# Patient Record
Sex: Female | Born: 1960
Health system: Southern US, Community
[De-identification: ages and names within clinical notes are randomized; demographics above are authoritative.]

## PROBLEM LIST (undated history)

## (undated) DIAGNOSIS — I10 Essential (primary) hypertension: Secondary | ICD-10-CM

## (undated) DIAGNOSIS — J45909 Unspecified asthma, uncomplicated: Secondary | ICD-10-CM

## (undated) DIAGNOSIS — F32A Depression, unspecified: Secondary | ICD-10-CM

## (undated) DIAGNOSIS — M797 Fibromyalgia: Secondary | ICD-10-CM

## (undated) DIAGNOSIS — F419 Anxiety disorder, unspecified: Secondary | ICD-10-CM

## (undated) DIAGNOSIS — K219 Gastro-esophageal reflux disease without esophagitis: Secondary | ICD-10-CM

## (undated) HISTORY — PX: THROAT SURGERY: SHX803

## (undated) HISTORY — PX: CYST EXCISION: SHX5701

## (undated) HISTORY — PX: COLONOSCOPY: SHX174

## (undated) HISTORY — PX: BREAST BIOPSY: SHX20

---

## 1990-09-14 HISTORY — PX: TUBAL LIGATION: SHX77

## 2004-10-15 ENCOUNTER — Ambulatory Visit: Payer: Self-pay | Admitting: Family Medicine

## 2005-02-19 ENCOUNTER — Ambulatory Visit: Payer: Self-pay | Admitting: Family Medicine

## 2005-04-16 ENCOUNTER — Ambulatory Visit: Payer: Self-pay | Admitting: Internal Medicine

## 2005-05-08 ENCOUNTER — Ambulatory Visit: Payer: Self-pay | Admitting: Internal Medicine

## 2005-08-17 ENCOUNTER — Ambulatory Visit: Payer: Self-pay | Admitting: Family Medicine

## 2005-08-21 ENCOUNTER — Ambulatory Visit: Payer: Self-pay | Admitting: Family Medicine

## 2005-09-04 ENCOUNTER — Ambulatory Visit: Payer: Self-pay | Admitting: Family Medicine

## 2005-11-03 ENCOUNTER — Ambulatory Visit: Payer: Self-pay | Admitting: Family Medicine

## 2005-11-16 ENCOUNTER — Ambulatory Visit: Payer: Self-pay | Admitting: Gastroenterology

## 2006-05-12 ENCOUNTER — Ambulatory Visit: Payer: Self-pay | Admitting: Family Medicine

## 2007-02-14 ENCOUNTER — Ambulatory Visit: Payer: Self-pay | Admitting: Specialist

## 2007-02-22 ENCOUNTER — Ambulatory Visit: Payer: Self-pay | Admitting: Gastroenterology

## 2007-05-28 IMAGING — CR DG ABDOMEN 1V
1 series · 1 of 1 positions shown · non-contrast
Comparison: none

REASON FOR EXAM: Left flank/lumbar pain
COMMENTS:

[view not recorded]
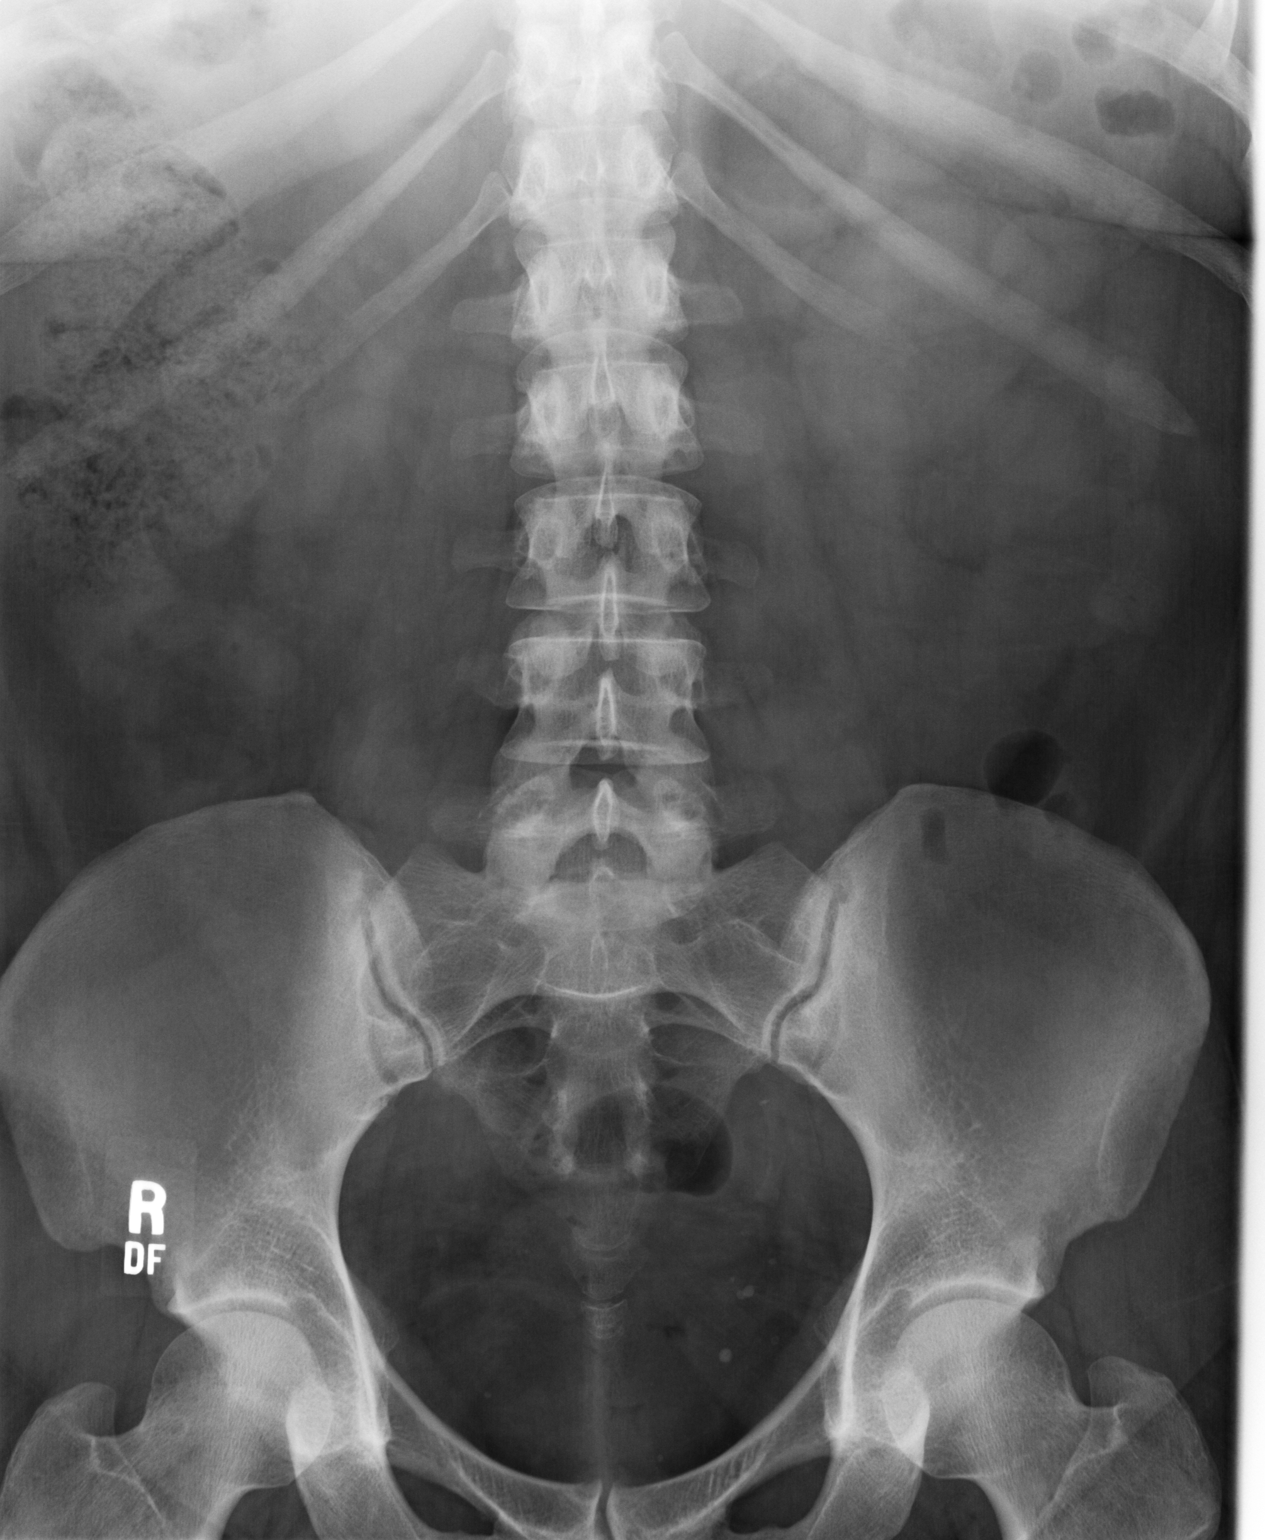

[1 of 1 positions shown; findings below may reference images not displayed]

PROCEDURE:     DXR - DXR KIDNEY URETER BLADDER  - August 17, 2005  [DATE]

RESULT:     AP view of the abdomen shows a normal bowel gas pattern. There
is a moderate amount of fecal material in the RIGHT colon. No evidence for
bowel obstruction is seen. There are a few, nonspecific calcifications in
the LEFT pelvis. Most of these are thought to represent phleboliths but the
possibility of at least one of these representing a distal LEFT ureteral
stone cannot be totally excluded on plain film examination. If such is of
clinical consideration, further evaluation by CT or IVP might be desired.
The psoas margins are visualized bilaterally. The osseous structures are
normal in appearance.
IMPRESSION: 1.     There are noted nonspecific calcifications in the LEFT pelvis as
noted above.
2.     No bowel obstruction is seen.
3.     There is a moderate amount of fecal material in the RIGHT colon.

## 2013-11-14 HISTORY — PX: DIRECT LARYNGOSCOPY: SHX5326

## 2017-03-03 DIAGNOSIS — M545 Low back pain, unspecified: Secondary | ICD-10-CM | POA: Insufficient documentation

## 2017-03-03 DIAGNOSIS — R52 Pain, unspecified: Secondary | ICD-10-CM | POA: Insufficient documentation

## 2017-06-30 ENCOUNTER — Other Ambulatory Visit: Payer: Self-pay | Admitting: Thoracic Surgery

## 2017-06-30 DIAGNOSIS — M545 Low back pain: Secondary | ICD-10-CM

## 2017-07-13 ENCOUNTER — Ambulatory Visit: Payer: Disability Insurance | Attending: Thoracic Surgery

## 2017-07-13 DIAGNOSIS — K589 Irritable bowel syndrome without diarrhea: Secondary | ICD-10-CM | POA: Insufficient documentation

## 2017-07-13 DIAGNOSIS — F329 Major depressive disorder, single episode, unspecified: Secondary | ICD-10-CM | POA: Diagnosis not present

## 2017-07-13 DIAGNOSIS — R200 Anesthesia of skin: Secondary | ICD-10-CM | POA: Diagnosis not present

## 2017-07-13 DIAGNOSIS — M797 Fibromyalgia: Secondary | ICD-10-CM | POA: Diagnosis not present

## 2017-07-13 DIAGNOSIS — M545 Low back pain: Secondary | ICD-10-CM

## 2017-07-13 DIAGNOSIS — M7989 Other specified soft tissue disorders: Secondary | ICD-10-CM | POA: Insufficient documentation

## 2017-07-13 DIAGNOSIS — J984 Other disorders of lung: Secondary | ICD-10-CM | POA: Insufficient documentation

## 2017-07-13 DIAGNOSIS — F419 Anxiety disorder, unspecified: Secondary | ICD-10-CM | POA: Insufficient documentation

## 2017-07-13 DIAGNOSIS — M549 Dorsalgia, unspecified: Secondary | ICD-10-CM | POA: Diagnosis not present

## 2017-07-13 MED ORDER — ALBUTEROL SULFATE (2.5 MG/3ML) 0.083% IN NEBU
2.5000 mg | INHALATION_SOLUTION | Freq: Once | RESPIRATORY_TRACT | Status: AC
Start: 2017-07-13 — End: 2017-07-13
  Administered 2017-07-13: 2.5 mg via RESPIRATORY_TRACT
  Filled 2017-07-13: qty 3

## 2018-01-27 DIAGNOSIS — F329 Major depressive disorder, single episode, unspecified: Secondary | ICD-10-CM | POA: Insufficient documentation

## 2018-11-14 DIAGNOSIS — I10 Essential (primary) hypertension: Secondary | ICD-10-CM | POA: Insufficient documentation

## 2019-04-13 DIAGNOSIS — Z800 Family history of malignant neoplasm of digestive organs: Secondary | ICD-10-CM | POA: Insufficient documentation

## 2021-09-15 ENCOUNTER — Ambulatory Visit: Payer: Self-pay

## 2021-09-30 ENCOUNTER — Ambulatory Visit (INDEPENDENT_AMBULATORY_CARE_PROVIDER_SITE_OTHER): Payer: Managed Care, Other (non HMO)

## 2021-09-30 ENCOUNTER — Other Ambulatory Visit: Payer: Self-pay

## 2021-09-30 ENCOUNTER — Ambulatory Visit
Admission: RE | Admit: 2021-09-30 | Discharge: 2021-09-30 | Disposition: A | Discharge status: Home / Self Care | Payer: Managed Care, Other (non HMO) | Source: Ambulatory Visit

## 2021-09-30 VITALS — BP 176/96 | HR 73 | Temp 98.8°F | Resp 16

## 2021-09-30 DIAGNOSIS — R051 Acute cough: Secondary | ICD-10-CM | POA: Diagnosis not present

## 2021-09-30 DIAGNOSIS — R059 Cough, unspecified: Secondary | ICD-10-CM | POA: Diagnosis not present

## 2021-09-30 DIAGNOSIS — J019 Acute sinusitis, unspecified: Secondary | ICD-10-CM | POA: Diagnosis not present

## 2021-09-30 DIAGNOSIS — R0981 Nasal congestion: Secondary | ICD-10-CM

## 2021-09-30 HISTORY — DX: Anxiety disorder, unspecified: F41.9

## 2021-09-30 HISTORY — DX: Fibromyalgia: M79.7

## 2021-09-30 HISTORY — DX: Essential (primary) hypertension: I10

## 2021-09-30 HISTORY — DX: Gastro-esophageal reflux disease without esophagitis: K21.9

## 2021-09-30 HISTORY — DX: Depression, unspecified: F32.A

## 2021-09-30 MED ORDER — DOXYCYCLINE HYCLATE 100 MG PO CAPS: 100.0000 mg | ORAL_CAPSULE | Freq: Two times a day (BID) | ORAL | 0 refills | Status: AC

## 2021-09-30 MED ORDER — GUAIFENESIN-CODEINE 100-10 MG/5ML PO SYRP: 10.0000 mL | ORAL_SOLUTION | Freq: Three times a day (TID) | ORAL | 0 refills | Status: DC | PRN

## 2021-09-30 NOTE — ED Provider Notes (Signed)
[facility]    CSN: [id number] Arrival date & time: 09/30/21  1435      History   Chief Complaint Chief Complaint  Patient presents with   Appointment    1500   Generalized Body Aches   Nasal Congestion   Headache   Cough    HPI Angela Deleon is a 61 y.o. female presenting for nearly 1 month history of cough and nasal congestion.  Patient states she felt better until a few days ago but then her nasal congestion got worse and now she is complaining of a lot of facial pain and pressure.  Denies any fevers.  Reports increase in body aches but has history of fibromyalgia.  Has not had any fevers.  Cough is occasionally productive.  Admits to occasional shortness of breath as well.  Denies headaches, ear pain, chest pain, nausea/vomiting or diarrhea.  Has been taking multiple over-the-counter cough medicines but says they have not really helped.  Has been having sleep trouble due to cough.  No sick contacts or known exposure to COVID.  Has tested negative for COVID-19 multiple times.  HPI  Past Medical History:  Diagnosis Date   Acid reflux    Anxiety    Depression    Fibromyalgia    Hypertension     There are no problems to display for this patient.   Past Surgical History:  Procedure Laterality Date   THROAT SURGERY      OB History   No obstetric history on file.      Home Medications    Prior to Admission medications   Medication Sig Start Date End Date Taking? Authorizing Provider  doxycycline (VIBRAMYCIN) 100 MG capsule Take 1 capsule (100 mg total) by mouth 2 (two) times daily for 7 days. 09/30/21 10/07/21 Yes Laurene Footman B, PA-C  escitalopram (LEXAPRO) 20 MG tablet Take by mouth. 12/17/20 12/17/21 Yes [provider]  famotidine (PEPCID) 20 MG tablet Take by mouth. 03/13/19  Yes [provider]  gabapentin (NEURONTIN) 300 MG capsule Take by mouth. 04/13/19  Yes [provider]  guaiFENesin-codeine (ROBITUSSIN AC) 100-10 MG/5ML  syrup Take 10 mLs by mouth 3 (three) times daily as needed for cough. 09/30/21  Yes Danton Clap, PA-C  lisinopril-hydrochlorothiazide (ZESTORETIC) 20-12.5 MG tablet Take 1 tablet by mouth daily. 01/27/18  Yes [provider]    Family History History reviewed. No pertinent family history.  Social History Social History   Tobacco Use   Smoking status: Every Day    Types: Cigarettes   Smokeless tobacco: Never  Vaping Use   Vaping Use: Never used  Substance Use Topics   Alcohol use: Yes    Comment: social drinker   Drug use: Never     Allergies   Iodinated contrast media and Penicillins   Review of Systems Review of Systems  Constitutional:  Positive for fatigue. Negative for chills, diaphoresis and fever.  HENT:  Positive for rhinorrhea, sinus pressure and sinus pain. Negative for congestion, ear pain and sore throat.   Respiratory:  Positive for cough and shortness of breath.   Cardiovascular:  Negative for chest pain.  Gastrointestinal:  Negative for abdominal pain, nausea and vomiting.  Musculoskeletal:  Positive for myalgias. Negative for arthralgias.  Skin:  Negative for rash.  Neurological:  Negative for weakness and headaches.  Hematological:  Negative for adenopathy.  Psychiatric/Behavioral:  Positive for sleep disturbance.     Physical Exam Triage Vital Signs ED Triage Vitals  Enc  Vitals Group     BP 09/30/21 1455 (S) (!) 176/96     Pulse Rate 09/30/21 1455 73     Resp 09/30/21 1455 16     Temp 09/30/21 1455 98.8 F (37.1 C)     Temp Source 09/30/21 1455 Oral     SpO2 09/30/21 1455 95 %     Weight --      Height --      Head Circumference --      Peak Flow --      Pain Score 09/30/21 1451 10     Pain Loc --      Pain Edu? --      Excl. in [place]? --    No data found.  Updated Vital Signs BP (S) (!) 176/96 (BP Location: Right Arm) Comment: did not take BP med today, states working on controlling her BP with PCP.   Pulse 73    Temp 98.8 F  (37.1 C) (Oral)    Resp 16    SpO2 95%       Physical Exam Vitals and nursing note reviewed.  Constitutional:      General: She is not in acute distress.    Appearance: Normal appearance. She is well-developed. She is ill-appearing. She is not toxic-appearing.  HENT:     Head: Normocephalic and atraumatic.     Nose: Congestion present.     Right Sinus: Maxillary sinus tenderness present.     Left Sinus: Maxillary sinus tenderness present.     Mouth/Throat:     Mouth: Mucous membranes are moist.     Pharynx: Oropharynx is clear.  Eyes:     General: No scleral icterus.       Right eye: No discharge.        Left eye: No discharge.     Conjunctiva/sclera: Conjunctivae normal.  Cardiovascular:     Rate and Rhythm: Normal rate and regular rhythm.     Heart sounds: Normal heart sounds.  Pulmonary:     Effort: Pulmonary effort is normal. No respiratory distress.     Breath sounds: Normal breath sounds.  Musculoskeletal:     Cervical back: Neck supple.  Skin:    General: Skin is dry.  Neurological:     General: No focal deficit present.     Mental Status: She is alert. Mental status is at baseline.     Motor: No weakness.     Gait: Gait normal.  Psychiatric:        Mood and Affect: Mood normal.        Behavior: Behavior normal.        Thought Content: Thought content normal.     UC Treatments / Results  Labs (all labs ordered are listed, but only abnormal results are displayed) Labs Reviewed - No data to display  EKG   Radiology DG Chest 2 View  Result Date: 09/30/2021 CLINICAL DATA:  Cough and body aches for the past 3 weeks. EXAM: CHEST - 2 VIEW COMPARISON:  CT chest dated February 14, 2007. FINDINGS: The heart size and mediastinal contours are within normal limits. Both lungs are clear. The visualized skeletal structures are unremarkable. IMPRESSION: No active cardiopulmonary disease. Electronically Signed   By: Titus Dubin M.D.   On: 09/30/2021 15:44     Procedures Procedures (including critical care time)  Medications Ordered in UC Medications - No data to display  Initial Impression / Assessment and Plan / UC Course  I have reviewed the  triage vital signs and the nursing notes.  Pertinent labs & imaging results that were available during my care of the patient were reviewed by me and considered in my medical decision making (see chart for details).  61 year old female presenting for nearly 1 month history of cough and congestion.  Patient was improving until a few days ago when her congestion worsened and she developed body aches and facial pain.  Patient is presently afebrile.  She is ill-appearing but nontoxic.  Has nasal congestion on exam as well as maxillary sinus tenderness.  Chest clear to auscultation.  Chest x-ray obtained to assess for possible pneumonia.  Reviewed by me.  Result shows no acute abnormality.  Discussed results with patient.  Advised her that her symptoms at this time are consistent with acute sinusitis.  Treating with doxycycline as she has penicillin allergy.  Also prescribed Cheratussin after reviewing controlled substance database and finding her to be low risk for abuse.  Advised to continue nasal sprays, rest and fluids.  Reviewed return and ED precautions especially relating to cough and her shortness of breath.  I offered her an inhaler but she declines.  Follow-up as needed.  Final Clinical Impressions(s) / UC Diagnoses   Final diagnoses:  Acute sinusitis, recurrence not specified, unspecified location  Acute cough  Nasal congestion     Discharge Instructions      -Your chest xray was normal. No evidence of pneumonia -You have a sinus infection. I have sent antibiotics and cough meds. Increase rest and fluids -You should feel better over the next 7-10 days. If symptoms worsen or you are not feeling better, follow up with PCP or return for re-evaluation.    ED Prescriptions     Medication Sig  Dispense Auth. Provider   doxycycline (VIBRAMYCIN) 100 MG capsule Take 1 capsule (100 mg total) by mouth 2 (two) times daily for 7 days. 14 capsule Danton Clap, PA-C   guaiFENesin-codeine (ROBITUSSIN AC) 100-10 MG/5ML syrup Take 10 mLs by mouth 3 (three) times daily as needed for cough. 118 mL Danton Clap, PA-C      I have reviewed the PDMP during this encounter.   Danton Clap, PA-C 09/30/21 1609

## 2021-09-30 NOTE — Discharge Instructions (Addendum)
-  Your chest xray was normal. No evidence of pneumonia -You have a sinus infection. I have sent antibiotics and cough meds. Increase rest and fluids -You should feel better over the next 7-10 days. If symptoms worsen or you are not feeling better, follow up with PCP or return for re-evaluation.

## 2021-09-30 NOTE — ED Triage Notes (Signed)
Patient presents to Urgent Care with complaints of cough, body aches, headache, fatigue, and cough x 3 weeks. Last negative covid test last weds. Treating symptoms with nasal spray and mucinex with some relief.   Denies fever.

## 2021-12-29 DIAGNOSIS — F4001 Agoraphobia with panic disorder: Secondary | ICD-10-CM | POA: Diagnosis not present

## 2021-12-29 DIAGNOSIS — R69 Illness, unspecified: Secondary | ICD-10-CM | POA: Diagnosis not present

## 2022-01-21 ENCOUNTER — Ambulatory Visit
Admission: EM | Admit: 2022-01-21 | Discharge: 2022-01-21 | Disposition: A | Discharge status: Home / Self Care | Payer: 59 | Attending: Internal Medicine | Admitting: Internal Medicine

## 2022-01-21 ENCOUNTER — Ambulatory Visit: Payer: Self-pay

## 2022-01-21 DIAGNOSIS — I160 Hypertensive urgency: Secondary | ICD-10-CM | POA: Insufficient documentation

## 2022-01-21 DIAGNOSIS — K047 Periapical abscess without sinus: Secondary | ICD-10-CM | POA: Insufficient documentation

## 2022-01-21 DIAGNOSIS — G43101 Migraine with aura, not intractable, with status migrainosus: Secondary | ICD-10-CM | POA: Insufficient documentation

## 2022-01-21 DIAGNOSIS — I10 Essential (primary) hypertension: Secondary | ICD-10-CM | POA: Insufficient documentation

## 2022-01-21 DIAGNOSIS — Z599 Problem related to housing and economic circumstances, unspecified: Secondary | ICD-10-CM | POA: Insufficient documentation

## 2022-01-21 LAB — CBC WITH DIFFERENTIAL/PLATELET
Abs Immature Granulocytes: 0.02 10*3/uL (ref 0.00–0.07)
Basophils Absolute: 0 10*3/uL (ref 0.0–0.1)
Basophils Relative: 0 %
Eosinophils Absolute: 0.1 10*3/uL (ref 0.0–0.5)
Eosinophils Relative: 1 %
HCT: 42.7 % (ref 36.0–46.0)
Hemoglobin: 14 g/dL (ref 12.0–15.0)
Immature Granulocytes: 0 %
Lymphocytes Relative: 25 %
Lymphs Abs: 1.9 10*3/uL (ref 0.7–4.0)
MCH: 27.7 pg (ref 26.0–34.0)
MCHC: 32.8 g/dL (ref 30.0–36.0)
MCV: 84.4 fL (ref 80.0–100.0)
Monocytes Absolute: 0.5 10*3/uL (ref 0.1–1.0)
Monocytes Relative: 7 %
Neutro Abs: 5 10*3/uL (ref 1.7–7.7)
Neutrophils Relative %: 67 %
Platelets: 242 10*3/uL (ref 150–400)
RBC: 5.06 MIL/uL (ref 3.87–5.11)
RDW: 14.7 % (ref 11.5–15.5)
WBC: 7.5 10*3/uL (ref 4.0–10.5)
nRBC: 0 % (ref 0.0–0.2)

## 2022-01-21 LAB — BASIC METABOLIC PANEL
Anion gap: 7 (ref 5–15)
BUN: 14 mg/dL (ref 6–20)
CO2: 24 mmol/L (ref 22–32)
Calcium: 8.8 mg/dL — ABNORMAL LOW (ref 8.9–10.3)
Chloride: 104 mmol/L (ref 98–111)
Creatinine, Ser: 0.85 mg/dL (ref 0.44–1.00)
GFR, Estimated: >60 mL/min (ref >60)
Glucose, Bld: 98 mg/dL (ref 70–99)
Potassium: 3.9 mmol/L (ref 3.5–5.1)
Sodium: 135 mmol/L (ref 135–145)

## 2022-01-21 MED ORDER — KETOROLAC TROMETHAMINE 30 MG/ML IJ SOLN
30.0000 mg | Freq: Once | INTRAMUSCULAR | Status: AC
  Administered 2022-01-21: 30 mg via INTRAMUSCULAR

## 2022-01-21 MED ORDER — SUMATRIPTAN SUCCINATE 50 MG PO TABS: 50.0000 mg | ORAL_TABLET | Freq: Once | ORAL | 0 refills | Status: DC

## 2022-01-21 MED ORDER — IBUPROFEN 600 MG PO TABS: 600.0000 mg | ORAL_TABLET | Freq: Four times a day (QID) | ORAL | 0 refills | Status: DC | PRN

## 2022-01-21 MED ORDER — CLINDAMYCIN HCL 300 MG PO CAPS: 300.0000 mg | ORAL_CAPSULE | Freq: Three times a day (TID) | ORAL | 0 refills | Status: AC

## 2022-01-21 MED ORDER — LISINOPRIL-HYDROCHLOROTHIAZIDE 20-12.5 MG PO TABS: 1.0000 | ORAL_TABLET | Freq: Every day | ORAL | 1 refills | Status: AC

## 2022-01-21 MED ORDER — SUMATRIPTAN SUCCINATE 6 MG/0.5ML ~~LOC~~ SOLN
6.0000 mg | Freq: Once | SUBCUTANEOUS | Status: AC
  Administered 2022-01-21: 6 mg via SUBCUTANEOUS

## 2022-01-21 MED ORDER — METOCLOPRAMIDE HCL 5 MG/ML IJ SOLN
5.0000 mg | Freq: Once | INTRAMUSCULAR | Status: AC
  Administered 2022-01-21: 5 mg via INTRAMUSCULAR

## 2022-01-21 NOTE — ED Triage Notes (Signed)
Patient is here for "Dental pain, Lower right side, abscess". Started hurting about "3 days ago". "Also BP at [facility] yesterday was High, to discuss, Reading was 179/97 at highest". Some ha's at times. No chest pain. No sob. History of HTN.  ?

## 2022-01-21 NOTE — ED Provider Notes (Addendum)
?[facility] ? ? ? ?CSN: [id number] ?Arrival date & time: 01/21/22  1143 ? ? ?  ? ?History   ?Chief Complaint ?Chief Complaint  ?Patient presents with  ? Hypertension  ? Dental Pain  ? ? ?HPI ?Angela Deleon is a 61 y.o. female with a history of hypertension previously managed on lisinopril-HCTZ comes to urgent care with concern for elevated blood pressure, left-sided headache which worsened yesterday.  Patient says headache left-sided and behind the left eye.  Pain is aggravated by bright light and noise and relieved when she is in a quiet dark place.  It is associated with black and white floaters in her visual field.  She has not tried any medications for the headache.  Patient stopped taking antihypertensive agents because she was advised by her primary care physician at that time to stop taking it because blood pressure readings. ?Patient also comes in for right sided dental pain.  She has poor dental hygiene.  Pain is throbbing, severe and not relieved by over-the-counter medications.  She denies any chest pain or abdominal pain. ?HPI ? ?Past Medical History:  ?Diagnosis Date  ? Acid reflux   ? Anxiety   ? Depression   ? Fibromyalgia   ? Hypertension   ? ? ?Patient Active Problem List  ? Diagnosis Date Noted  ? Financial difficulties 01/21/2022  ? Family history of colon cancer in mother 04/13/2019  ? Essential hypertension 11/14/2018  ? Major depressive disorder 01/27/2018  ? Lower back pain 03/03/2017  ? Generalized pain 03/03/2017  ? ? ?Past Surgical History:  ?Procedure Laterality Date  ? THROAT SURGERY    ? ? ?OB History   ?No obstetric history on file. ?  ? ? ? ?Home Medications   ? ?Prior to Admission medications   ?Medication Sig Start Date End Date Taking? Authorizing Provider  ?clindamycin (CLEOCIN) 300 MG capsule Take 1 capsule (300 mg total) by mouth 3 (three) times daily for 7 days. 01/21/22 01/28/22 Yes Perle Gibbon, Myrene Galas, MD  ?clonazePAM (KLONOPIN) 0.5 MG tablet Take 0.5 mg by mouth. 12/29/21   Yes [provider]  ?escitalopram (LEXAPRO) 20 MG tablet Take by mouth. 12/17/20 01/21/22 Yes [provider]  ?gabapentin (NEURONTIN) 300 MG capsule Take by mouth. 04/13/19  Yes [provider]  ?ibuprofen (ADVIL) 600 MG tablet Take 1 tablet (600 mg total) by mouth every 6 (six) hours as needed. 01/21/22  Yes Shandale Malak, Myrene Galas, MD  ?QUEtiapine (SEROQUEL) 50 MG tablet Take 50 mg by mouth. 1 and 1.5 mg 12/29/21  Yes [provider]  ?SUMAtriptan (IMITREX) 50 MG tablet Take 1 tablet (50 mg total) by mouth once for 1 dose. May repeat in 2 hours if headache persists or recurs. 01/21/22 01/21/22 Yes  Snooks, Myrene Galas, MD  ?buPROPion [facility] SR) 100 MG 12 hr tablet Take 100 mg by mouth every morning. 12/29/21   [provider]  ?escitalopram (LEXAPRO) 10 MG tablet Take 10 mg by mouth daily. 01/16/22   [provider]  ?famotidine (PEPCID) 20 MG tablet Take by mouth. 03/13/19   [provider]  ?lisinopril-hydrochlorothiazide (ZESTORETIC) 20-12.5 MG tablet Take 1 tablet by mouth daily. 01/21/22   Zollie Clemence, Myrene Galas, MD  ? ? ?Family History ?No family history on file. ? ?Social History ?Social History  ? ?Tobacco Use  ? Smoking status: Every Day  ?  Types: Cigarettes  ? Smokeless tobacco: Never  ?Vaping Use  ? Vaping Use: Never used  ?Substance Use Topics  ?  Alcohol use: Yes  ?  Comment: social drinker  ? Drug use: Never  ? ? ? ?Allergies   ?Iodinated contrast media and Penicillins ? ? ?Review of Systems ?Review of Systems ?As per HPI ? ?Physical Exam ?Triage Vital Signs ?ED Triage Vitals  ?Enc Vitals Group  ?   BP 01/21/22 1205 (!) 200/95  ?   Pulse Rate 01/21/22 1205 79  ?   Resp 01/21/22 1205 20  ?   Temp 01/21/22 1205 98.3 ?F (36.8 ?C)  ?   Temp Source 01/21/22 1205 Oral  ?   SpO2 01/21/22 1205 97 %  ?   Weight 01/21/22 1202 170 lb (77.1 kg)  ?   Height 01/21/22 1202 '5\' 1"'$  (1.549 m)  ?   Head Circumference --   ?   Peak Flow --   ?   Pain Score 01/21/22 1159 10   ?   Pain Loc --   ?   Pain Edu? --   ?   Excl. in [place]? --   ? ?No data found. ? ?Updated Vital Signs ?BP (!) 210/101 (BP Location: Right Arm)   Pulse 78   Temp 98.3 ?F (36.8 ?C) (Oral)   Resp 20   Ht '5\' 1"'$  (1.549 m)   Wt 77.1 kg   SpO2 99%   BMI 32.12 kg/m?  ? ?Visual Acuity ?Right Eye Distance:   ?Left Eye Distance:   ?Bilateral Distance:   ? ?Right Eye Near:   ?Left Eye Near:    ?Bilateral Near:    ? ?Physical Exam ?Vitals and nursing note reviewed.  ?Constitutional:   ?   General: She is not in acute distress. ?   Appearance: She is not ill-appearing.  ?HENT:  ?   Mouth/Throat:  ?   Comments: Poor dental hygiene.  Multiple dental cavities in the right mandibular and maxillary premolars and molars. ?Cardiovascular:  ?   Rate and Rhythm: Normal rate and regular rhythm.  ?   Pulses: Normal pulses.  ?   Heart sounds: Normal heart sounds.  ?Pulmonary:  ?   Effort: Pulmonary effort is normal.  ?   Breath sounds: Normal breath sounds.  ?Neurological:  ?   Mental Status: She is alert.  ? ? ? ?UC Treatments / Results  ?Labs ?(all labs ordered are listed, but only abnormal results are displayed) ?Labs Reviewed  ?BASIC METABOLIC PANEL - Abnormal; Notable for the following components:  ?    Result Value  ? Calcium 8.8 (*)   ? All other components within normal limits  ?CBC WITH DIFFERENTIAL/PLATELET  ? ? ?EKG ? ? ?Radiology ?No results found. ? ?Procedures ?Procedures (including critical care time) ? ?Medications Ordered in UC ?Medications  ?ketorolac (TORADOL) 30 MG/ML injection 30 mg (30 mg Intramuscular Given 01/21/22 1304)  ?metoCLOPramide (REGLAN) injection 5 mg (5 mg Intramuscular Given 01/21/22 1304)  ?SUMAtriptan (IMITREX) injection 6 mg (6 mg Subcutaneous Given 01/21/22 1304)  ? ? ?Initial Impression / Assessment and Plan / UC Course  ?I have reviewed the triage vital signs and the nursing notes. ? ?Pertinent labs & imaging results that were available during my care of the patient were reviewed by me and  considered in my medical decision making (see chart for details). ? ?  ? ? ? ?1.  Hypertensive urgency secondary to acute migraine: ?CBC, BMP ?Toradol 30 mg IM x1 dose ?Reglan 5 mg IM x1 dose ?Imitrex 6 mg subcu x1 dose ?Patient to started on lisinopril-HCTZ. ? ?2.  Dental infection: ?Clindamycin 300 mg 3 times daily for 7 days ?Ibuprofen as needed for pain ?Return to urgent care if symptoms worsen. ?Final Clinical Impressions(s) / UC Diagnoses  ? ?Final diagnoses:  ?Essential hypertension  ?Hypertensive urgency  ?Migraine with aura and with status migrainosus, not intractable  ?Dental infection  ? ? ? ?Discharge Instructions   ? ?  ?Please take antibiotics as prescribed ?Please take medications for the migraine as prescribed.  Take Imitrex as soon as the headache starts. ?Check your blood pressure regularly ?Take antihypertensive medications as prescribed ?We will call you with recommendations if labs are abnormal ?Please follow-up with a dentist for dental care. ?Return to urgent care if you have any other symptoms or worsening symptoms. ? ? ?ED Prescriptions   ? ? Medication Sig Dispense Auth. Provider  ? clindamycin (CLEOCIN) 300 MG capsule Take 1 capsule (300 mg total) by mouth 3 (three) times daily for 7 days. 21 capsule Livingston Denner, Myrene Galas, MD  ? ibuprofen (ADVIL) 600 MG tablet Take 1 tablet (600 mg total) by mouth every 6 (six) hours as needed. 30 tablet Nayson Traweek, Myrene Galas, MD  ? SUMAtriptan (IMITREX) 50 MG tablet Take 1 tablet (50 mg total) by mouth once for 1 dose. May repeat in 2 hours if headache persists or recurs. 10 tablet Jinnifer Montejano, Myrene Galas, MD  ? lisinopril-hydrochlorothiazide (ZESTORETIC) 20-12.5 MG tablet Take 1 tablet by mouth daily. 30 tablet Nikaela Coyne, Myrene Galas, MD  ? ?  ? ?PDMP not reviewed this encounter. ?  ?Chase Picket, MD ?01/21/22 1620 ? ?  ?Chase Picket, MD ?01/21/22 1621 ? ?

## 2022-01-21 NOTE — Discharge Instructions (Addendum)
Please take antibiotics as prescribed ?Please take medications for the migraine as prescribed.  Take Imitrex as soon as the headache starts. ?Check your blood pressure regularly ?Take antihypertensive medications as prescribed ?We will call you with recommendations if labs are abnormal ?Please follow-up with a dentist for dental care. ?Return to urgent care if you have any other symptoms or worsening symptoms. ?

## 2022-02-17 ENCOUNTER — Inpatient Hospital Stay: Admission: RE | Admit: 2022-02-17 | Payer: Self-pay | Source: Ambulatory Visit

## 2022-02-17 ENCOUNTER — Ambulatory Visit
Admission: EM | Admit: 2022-02-17 | Discharge: 2022-02-17 | Disposition: A | Discharge status: Home / Self Care | Payer: 59 | Attending: Physician Assistant | Admitting: Physician Assistant

## 2022-02-17 NOTE — ED Triage Notes (Signed)
Pt reports having panic attacks noted every 7-8 episodes a week and started after the increased stress shes dealing with.

## 2022-02-17 NOTE — ED Triage Notes (Signed)
Patient presents to Urgent Care with complaints of continued dizziness and hypertension since 05/10 visit. Pt states she was given lisinopril 12.'5mg'$  and migraine meds last visit. She states she has noted by mid afternoon she begins to develop HA, dizziness when changing position, and fatigue. She states she is unsure if the increased amount of stress is the cause of her symptoms. Treating HA with migraine med with some relief.

## 2022-02-18 ENCOUNTER — Ambulatory Visit
Admission: EM | Admit: 2022-02-18 | Discharge: 2022-02-18 | Disposition: A | Discharge status: Home / Self Care | Payer: Disability Insurance | Attending: Emergency Medicine | Admitting: Emergency Medicine

## 2022-02-18 VITALS — BP 184/97 | HR 77 | Temp 98.1°F | Resp 18

## 2022-02-18 DIAGNOSIS — K219 Gastro-esophageal reflux disease without esophagitis: Secondary | ICD-10-CM | POA: Diagnosis not present

## 2022-02-18 DIAGNOSIS — F430 Acute stress reaction: Secondary | ICD-10-CM

## 2022-02-18 DIAGNOSIS — R69 Illness, unspecified: Secondary | ICD-10-CM | POA: Diagnosis not present

## 2022-02-18 DIAGNOSIS — G44201 Tension-type headache, unspecified, intractable: Secondary | ICD-10-CM

## 2022-02-18 DIAGNOSIS — R42 Dizziness and giddiness: Secondary | ICD-10-CM | POA: Diagnosis not present

## 2022-02-18 DIAGNOSIS — R519 Headache, unspecified: Secondary | ICD-10-CM | POA: Diagnosis not present

## 2022-02-18 MED ORDER — BACLOFEN 10 MG PO TABS: 10.0000 mg | ORAL_TABLET | Freq: Three times a day (TID) | ORAL | 0 refills | Status: DC

## 2022-02-18 MED ORDER — OMEPRAZOLE 20 MG PO CPDR: 20.0000 mg | DELAYED_RELEASE_CAPSULE | Freq: Every day | ORAL | 1 refills | Status: DC

## 2022-02-18 MED ORDER — PREDNISONE 10 MG (21) PO TBPK: ORAL_TABLET | ORAL | 0 refills | Status: DC

## 2022-02-18 NOTE — Discharge Instructions (Addendum)
[place] to discuss medication options to help you with stress.  Stop the Imitrex for now and start the Prednisone tomorrow morning. You will take it each morning at breakfast.  Take the Baclofen 5 mg every 8 hours to help with muscle tension in yoru neck.  Continue your blood pressure medication as prescribed.  Increase your oral fluid intake to 64 ounces daily at least.   Take OTC Pepcid 20 mg twice daily to help with reflux symptoms. Also take Omeprazole 20 mg daily to help with reflux.  If your headache continues you need to go to the ER for evaluation.

## 2022-02-18 NOTE — ED Provider Notes (Signed)
[facility]    CSN: [id number] Arrival date & time: 02/18/22  1310      History   Chief Complaint Chief Complaint  Patient presents with   Dizziness   Hypertension   Follow-up    HPI Angela Deleon is a 61 y.o. female.   HPI  61 year old female here for reevaluation.  Patient was first evaluated on 01/21/2022 and diagnosed with essential hypertension, hypertensive urgency, migraine with aura and status migrainous, and a dental infection.  She was discharged home on ibuprofen, clindamycin, sumatriptan, and lisinopril/hydrochlorothiazide 20-12.5 mg once daily.  She states she has been taking her blood pressure medication but she is concerned that it may not be a high enough dose as she is continue to have a headache and elevated blood pressure.  She rates her headache pain as a 10/10 and states it has been a 10/10 since she was first evaluated.  She states the headache starts in the back of her head and comes up to the crown.  She has also been taking ibuprofen and sumatriptan hand without any significant improvement of her pain.  She states during the morning, it gets worse in the afternoon, and better but does not completely resolve in the evening.  She is a smoker and smokes half pack a day.  She has been smoking for 40 years.  She states that she does have some dizziness but is mostly with position change or if she kneels down and then stands up quickly.  She denies any changes in vision or vomiting.  She has had some mild intermittent nausea.  She also denies any chest pain or numbness, tingling, or weakness in any of her extremities.  She reports that she has been under an increased amount of stress as she is unable to work and she recently found out her youngest brother has leukemia.  She is also been going through a lot of family stress.  Additionally, she is complaining that her reflux symptoms are worsening and not responding to the Pepcid she takes twice daily.  She uses some  of her roommates omeprazole which improves her symptoms.  Past Medical History:  Diagnosis Date   Acid reflux    Anxiety    Depression    Fibromyalgia    Hypertension     Patient Active Problem List   Diagnosis Date Noted   Financial difficulties 01/21/2022   Family history of colon cancer in mother 04/13/2019   Essential hypertension 11/14/2018   Major depressive disorder 01/27/2018   Lower back pain 03/03/2017   Generalized pain 03/03/2017    Past Surgical History:  Procedure Laterality Date   THROAT SURGERY      OB History   No obstetric history on file.      Home Medications    Prior to Admission medications   Medication Sig Start Date End Date Taking? Authorizing Provider  baclofen (LIORESAL) 10 MG tablet Take 1 tablet (10 mg total) by mouth 3 (three) times daily. 02/18/22  Yes Margarette Canada, NP  omeprazole (PRILOSEC) 20 MG capsule Take 1 capsule (20 mg total) by mouth daily. 02/18/22  Yes Margarette Canada, NP  predniSONE (STERAPRED UNI-PAK 21 TAB) 10 MG (21) TBPK tablet Take 6 tablets on day 1, 5 tablets day 2, 4 tablets day 3, 3 tablets day 4, 2 tablets day 5, 1 tablet day 6 02/18/22  Yes Margarette Canada, NP  buPROPion ER [facility] SR) 100 MG 12 hr tablet Take 100 mg by mouth every  morning. 12/29/21   [provider]  clonazePAM (KLONOPIN) 0.5 MG tablet Take 0.5 mg by mouth. 12/29/21   [provider]  escitalopram (LEXAPRO) 10 MG tablet Take 10 mg by mouth daily. 01/16/22   [provider]  escitalopram (LEXAPRO) 20 MG tablet Take by mouth. 12/17/20 01/21/22  [provider]  famotidine (PEPCID) 20 MG tablet Take by mouth. 03/13/19   [provider]  gabapentin (NEURONTIN) 300 MG capsule Take by mouth. 04/13/19   [provider]  ibuprofen (ADVIL) 600 MG tablet Take 1 tablet (600 mg total) by mouth every 6 (six) hours as needed. 01/21/22   Chase Picket, MD  lisinopril-hydrochlorothiazide (ZESTORETIC) 20-12.5 MG tablet Take 1  tablet by mouth daily. 01/21/22   Chase Picket, MD  QUEtiapine (SEROQUEL) 50 MG tablet Take 50 mg by mouth. 1 and 1.5 mg 12/29/21   [provider]  SUMAtriptan (IMITREX) 50 MG tablet Take 1 tablet (50 mg total) by mouth once for 1 dose. May repeat in 2 hours if headache persists or recurs. 01/21/22 01/21/22  LampteyMyrene Galas, MD    Family History History reviewed. No pertinent family history.  Social History Social History   Tobacco Use   Smoking status: Every Day    Types: Cigarettes   Smokeless tobacco: Never  Vaping Use   Vaping Use: Never used  Substance Use Topics   Alcohol use: Yes    Comment: social drinker   Drug use: Never     Allergies   Iodinated contrast media and Penicillins   Review of Systems Review of Systems  Respiratory:  Negative for shortness of breath.   Cardiovascular:  Negative for chest pain.  Gastrointestinal:  Positive for nausea.  Skin:  Negative for rash.  Neurological:  Positive for dizziness and headaches. Negative for weakness and numbness.  Psychiatric/Behavioral: Negative.      Physical Exam Triage Vital Signs ED Triage Vitals  Enc Vitals Group     BP 02/18/22 1319 (!) 206/95     Pulse Rate 02/18/22 1319 77     Resp 02/18/22 1319 18     Temp 02/18/22 1319 98.1 F (36.7 C)     Temp Source 02/18/22 1319 Oral     SpO2 02/18/22 1319 97 %     Weight --      Height --      Head Circumference --      Peak Flow --      Pain Score 02/18/22 1322 10     Pain Loc --      Pain Edu? --      Excl. in [place]? --    No data found.  Updated Vital Signs BP (!) 184/97 (BP Location: Right Arm)   Pulse 77   Temp 98.1 F (36.7 C) (Oral)   Resp 18   SpO2 97%   Visual Acuity Right Eye Distance:   Left Eye Distance:   Bilateral Distance:    Right Eye Near:   Left Eye Near:    Bilateral Near:     Physical Exam Vitals and nursing note reviewed.  Constitutional:      Appearance: Normal appearance. She is not ill-appearing.   HENT:     Head: Normocephalic and atraumatic.  Eyes:     General: No scleral icterus.       Right eye: No discharge.        Left eye: No discharge.     Extraocular Movements: Extraocular movements intact.  Conjunctiva/sclera: Conjunctivae normal.     Pupils: Pupils are equal, round, and reactive to light.  Neck:     Vascular: No carotid bruit.  Cardiovascular:     Rate and Rhythm: Normal rate and regular rhythm.     Pulses: Normal pulses.     Heart sounds: No murmur heard.   No friction rub. No gallop.  Pulmonary:     Effort: Pulmonary effort is normal.     Breath sounds: Normal breath sounds. No wheezing, rhonchi or rales.  Musculoskeletal:     Cervical back: Normal range of motion and neck supple. No tenderness.  Lymphadenopathy:     Cervical: No cervical adenopathy.  Skin:    General: Skin is warm and dry.     Capillary Refill: Capillary refill takes less than 2 seconds.     Findings: No erythema or rash.  Neurological:     General: No focal deficit present.     Mental Status: She is alert and oriented to person, place, and time.     Cranial Nerves: No cranial nerve deficit.     Sensory: No sensory deficit.     Motor: No weakness.     Coordination: Coordination normal.     Deep Tendon Reflexes: Reflexes normal.  Psychiatric:        Mood and Affect: Mood normal.        Behavior: Behavior normal.        Thought Content: Thought content normal.        Judgment: Judgment normal.     UC Treatments / Results  Labs (all labs ordered are listed, but only abnormal results are displayed) Labs Reviewed - No data to display  EKG Normal sinus rhythm with ventricular rate of 63 bpm Period of 150 ms Chris duration 80 ms QT/QTc 452/460 ms Patient is having prolonged QT as well as nonspecific T wave abnormalities.  She does have some mild flattening of her T waves in all of her V leads.  No elevation or inversion noted.  No other tracings available for  comparison.   Radiology No results found.  Procedures Procedures (including critical care time)  Medications Ordered in UC Medications - No data to display  Initial Impression / Assessment and Plan / UC Course  I have reviewed the triage vital signs and the nursing notes.  Pertinent labs & imaging results that were available during my care of the patient were reviewed by me and considered in my medical decision making (see chart for details).  Patient is a nontoxic-appearing 61 year old female here for evaluation of continued headache, elevated BP, dizziness with position change, and increased reflux that been going on since her initial evaluation on 01/21/2022 as outlined in HPI above.  On exam patient's cranial nerves II through XII are intact.  Her pupils are equal round reactive and her EOM is intact.  Cardiopulmonary exam reveals S1-S2 heart sounds with regular rate and rhythm and lung sounds that are clear to auscultation in all fields.  No carotid bruits appreciated when auscultating her neck bilaterally.  Bilateral upper extremity strength and grips are 5/5 in bilateral lower extremities are 5/5.  Patient's DTRs are equal bilaterally.  Patient's blood pressure has improved over her previous visit and it currently is 184/97.  I have advised her to continue taking her blood pressure medication as prescribed.  She has been using Imitrex for her headaches without any improvement but her description being occipital coming for to the crown sound more like tension  headaches and a stress manifestation than a true migraine.  I am going to have her stop the Imitrex and we will start her on a prednisone pack to see if we can break the cycle.  For her reflux disease I have encouraged her to continue taking Pepcid 20 mg twice daily and will add on omeprazole 20 mg once daily.  I have informed her that smoking increases her reflux symptoms.  She may would also consider cutting back on her coffee intake as  the acidity of the coffee may be contributing to her symptoms.  She only indicates that she has dizziness when she bends over and stands up quickly or if she kneels down and stands up quick and occurs with change position but is not present all the time.  Part of this may be due to the hydrochlorothiazide and her blood pressure combination medication and have encouraged her to increase her oral fluid intake to goal 64 ounces a day to see if those symptoms improve.  If her headache continues, or if she develops changes in vision, nausea or vomiting, or chest pain she needs to go to the ER for evaluation.  I have also encouraged her to reach out to [place] [facility] who she sees for her anxiety to discuss medication options to help her with stress.   Final Clinical Impressions(s) / UC Diagnoses   Final diagnoses:  Intractable tension-type headache, unspecified chronicity pattern  Stress reaction  Gastroesophageal reflux disease, unspecified whether esophagitis present     Discharge Instructions      [place] to discuss medication options to help you with stress.  Stop the Imitrex for now and start the Prednisone tomorrow morning. You will take it each morning at breakfast.  Take the Baclofen 5 mg every 8 hours to help with muscle tension in yoru neck.  Continue your blood pressure medication as prescribed.  Increase your oral fluid intake to 64 ounces daily at least.   Take OTC Pepcid 20 mg twice daily to help with reflux symptoms. Also take Omeprazole 20 mg daily to help with reflux.  If your headache continues you need to go to the ER for evaluation.       ED Prescriptions     Medication Sig Dispense Auth. Provider   predniSONE (STERAPRED UNI-PAK 21 TAB) 10 MG (21) TBPK tablet Take 6 tablets on day 1, 5 tablets day 2, 4 tablets day 3, 3 tablets day 4, 2 tablets day 5, 1 tablet day 6 21 tablet Margarette Canada, NP   baclofen (LIORESAL) 10 MG tablet Take  1 tablet (10 mg total) by mouth 3 (three) times daily. 30 each Margarette Canada, NP   omeprazole (PRILOSEC) 20 MG capsule Take 1 capsule (20 mg total) by mouth daily. 30 capsule Margarette Canada, NP      PDMP not reviewed this encounter.   Margarette Canada, NP 02/18/22 1427

## 2022-02-18 NOTE — ED Triage Notes (Signed)
Patient presents to Urgent Care with complaints of dizziness and hypertension since 05/10 visit. Pt states she was given lisinopril 12.'5mg'$  and migraine meds last visit. Symptoms wore by mid afternoon she begins to develop HA, nausea, dizziness when changing position, and fatigue. She states she is unsure if the increased amount of stress is the cause of her symptoms or if her BP meds need to be increased. Does not track her BP readings. Treating HA with migraine med with some relief. Hx of migraines and indigestion.

## 2022-02-23 DIAGNOSIS — R69 Illness, unspecified: Secondary | ICD-10-CM | POA: Diagnosis not present

## 2022-02-23 DIAGNOSIS — F339 Major depressive disorder, recurrent, unspecified: Secondary | ICD-10-CM | POA: Diagnosis not present

## 2022-03-23 DIAGNOSIS — F339 Major depressive disorder, recurrent, unspecified: Secondary | ICD-10-CM | POA: Diagnosis not present

## 2022-03-23 DIAGNOSIS — R69 Illness, unspecified: Secondary | ICD-10-CM | POA: Diagnosis not present

## 2022-05-19 DIAGNOSIS — F339 Major depressive disorder, recurrent, unspecified: Secondary | ICD-10-CM | POA: Diagnosis not present

## 2022-05-19 DIAGNOSIS — R69 Illness, unspecified: Secondary | ICD-10-CM | POA: Diagnosis not present

## 2022-06-30 ENCOUNTER — Other Ambulatory Visit: Payer: Self-pay | Admitting: Family Medicine

## 2022-06-30 DIAGNOSIS — N63 Unspecified lump in unspecified breast: Secondary | ICD-10-CM

## 2022-07-02 ENCOUNTER — Encounter: Payer: Self-pay | Admitting: *Deleted

## 2022-07-14 DIAGNOSIS — R69 Illness, unspecified: Secondary | ICD-10-CM | POA: Diagnosis not present

## 2022-07-14 DIAGNOSIS — F411 Generalized anxiety disorder: Secondary | ICD-10-CM | POA: Diagnosis not present

## 2022-07-27 ENCOUNTER — Ambulatory Visit
Admission: RE | Admit: 2022-07-27 | Discharge: 2022-07-27 | Disposition: A | Discharge status: Home / Self Care | Payer: 59 | Source: Ambulatory Visit | Attending: Family Medicine | Admitting: Family Medicine

## 2022-07-27 DIAGNOSIS — N63 Unspecified lump in unspecified breast: Secondary | ICD-10-CM

## 2022-07-27 DIAGNOSIS — N644 Mastodynia: Secondary | ICD-10-CM | POA: Diagnosis not present

## 2022-07-28 ENCOUNTER — Other Ambulatory Visit: Payer: Self-pay | Admitting: Family Medicine

## 2022-07-28 ENCOUNTER — Encounter: Payer: Self-pay | Admitting: Family Medicine

## 2022-07-28 DIAGNOSIS — R928 Other abnormal and inconclusive findings on diagnostic imaging of breast: Secondary | ICD-10-CM

## 2022-07-28 DIAGNOSIS — N63 Unspecified lump in unspecified breast: Secondary | ICD-10-CM

## 2022-08-13 ENCOUNTER — Ambulatory Visit
Admission: RE | Admit: 2022-08-13 | Discharge: 2022-08-13 | Disposition: A | Discharge status: Home / Self Care | Payer: Medicaid Other | Source: Ambulatory Visit | Attending: Family Medicine | Admitting: Family Medicine

## 2022-08-13 ENCOUNTER — Other Ambulatory Visit: Payer: Self-pay | Admitting: Family Medicine

## 2022-08-13 DIAGNOSIS — R928 Other abnormal and inconclusive findings on diagnostic imaging of breast: Secondary | ICD-10-CM | POA: Diagnosis not present

## 2022-08-13 DIAGNOSIS — C50411 Malignant neoplasm of upper-outer quadrant of right female breast: Secondary | ICD-10-CM | POA: Insufficient documentation

## 2022-08-13 DIAGNOSIS — N63 Unspecified lump in unspecified breast: Secondary | ICD-10-CM | POA: Insufficient documentation

## 2022-08-13 DIAGNOSIS — Z170 Estrogen receptor positive status [ER+]: Secondary | ICD-10-CM | POA: Insufficient documentation

## 2022-08-13 HISTORY — PX: BREAST BIOPSY: SHX20

## 2022-08-13 HISTORY — PX: OTHER SURGICAL HISTORY: SHX169

## 2022-08-13 MED ORDER — LIDOCAINE-EPINEPHRINE 1 %-1:100000 IJ SOLN
10.0000 mL | Freq: Once | INTRAMUSCULAR | Status: AC
  Administered 2022-08-13: 10 mL

## 2022-08-13 MED ORDER — LIDOCAINE-EPINEPHRINE 1 %-1:100000 IJ SOLN
8.0000 mL | Freq: Once | INTRAMUSCULAR | Status: AC
  Administered 2022-08-13: 8 mL

## 2022-08-13 MED ORDER — LIDOCAINE HCL (PF) 1 % IJ SOLN
3.0000 mL | Freq: Once | INTRAMUSCULAR | Status: AC
  Administered 2022-08-13: 3 mL

## 2022-08-14 ENCOUNTER — Encounter: Payer: Self-pay | Admitting: *Deleted

## 2022-08-14 DIAGNOSIS — Z419 Encounter for procedure for purposes other than remedying health state, unspecified: Secondary | ICD-10-CM | POA: Diagnosis not present

## 2022-08-14 DIAGNOSIS — C50919 Malignant neoplasm of unspecified site of unspecified female breast: Secondary | ICD-10-CM

## 2022-08-14 HISTORY — DX: Malignant neoplasm of unspecified site of unspecified female breast: C50.919

## 2022-08-14 NOTE — Progress Notes (Signed)
Received referral for newly diagnosed breast cancer from [facility].  Navigation initiated.  Appt. Scheduled with Dr. Darrall Dears for Tuesday 12/5.  Will call [facility] on Monday to get appt. For Psychologist, sport and exercise.

## 2022-08-17 ENCOUNTER — Encounter: Payer: Self-pay | Admitting: *Deleted

## 2022-08-17 DIAGNOSIS — C50919 Malignant neoplasm of unspecified site of unspecified female breast: Secondary | ICD-10-CM | POA: Insufficient documentation

## 2022-08-17 DIAGNOSIS — C50911 Malignant neoplasm of unspecified site of right female breast: Secondary | ICD-10-CM | POA: Insufficient documentation

## 2022-08-17 DIAGNOSIS — Z170 Estrogen receptor positive status [ER+]: Secondary | ICD-10-CM | POA: Insufficient documentation

## 2022-08-17 DIAGNOSIS — C779 Secondary and unspecified malignant neoplasm of lymph node, unspecified: Secondary | ICD-10-CM | POA: Insufficient documentation

## 2022-08-17 LAB — SURGICAL PATHOLOGY

## 2022-08-17 NOTE — Progress Notes (Unsigned)
[facility] CONSULT NOTE  Patient Care Team: Pcp, No as PCP - General Daiva Huge, RN as Oncology Nurse Navigator  REFERRING PROVIDER: Dr. Lynnda Shields  REASON FOR REFFERAL: newly diagnosed right breast cancer  CANCER STAGING   Cancer Staging  Breast cancer [facility]) Staging form: Breast, AJCC 8th Edition - Clinical stage from 08/13/2022: Stage IA (cT1c, cN0, cM0, G2, ER+, PR+, HER2-) - Signed by Jane Canary, MD on 08/17/2022 Stage prefix: Initial diagnosis Histologic grading system: 3 grade system   ASSESSMENT & PLAN:  Angela Deleon 61 y.o. female with pmh of fibromyalgia, hypertension and depression was seen in medical oncology to discuss treatment for right-sided breast cancer.  # Right breast invasive ductal cancer, stage IA ER/PR+, HER2- -self palpable. S/p US guided biopsy on 08/13/2022.  Path report as below.  -Discussed in detail about the cancer diagnosis, prognosis and treatment options.  It is looking like an early stage cancer.  Final staging will be determined after surgery.  The first step in the management is surgery which entails lumpectomy versus mastectomy.  Patient is scheduled to see Dr. Peyton Najjar on on Thursday.  Depending patient's decision, we will have radiation oncology consult.  The tumor is more than 5 mm in size so we will send for Oncotype testing to help [place] make decision about chemotherapy.  We discussed about aromatase inhibitors which include letrozole, anastrozole and exemestane.  Duration of treatment will likely be 5 years.  Patient was very anxious and overwhelmed with the diagnosis.  We will discuss about the side effects and DEXA scan at the next visit.  # Family history of colon, uterine and ovarian cancer -Referral to genetics  # Anxiety -Patient follows with [facility].  She is on multiple medications-Klonopin, Lexapro, Wellbutrin, Seroquel   Orders Placed This Encounter  Procedures   Ambulatory referral to Genetics     Referral Priority:   Routine    Referral Type:   Consultation    Referral Reason:   Specialty Services Required    Number of Visits Requested:   1   RTC 2 to 3 weeks after surgery.  The total time spent in the appointment was 60 minutes encounter with patients including review of chart and various tests results, discussions about plan of care and coordination of care plan   All questions were answered. The patient knows to call the clinic with any problems, questions or concerns. No barriers to learning was detected.  Jane Canary, MD 12/5/20239:49 AM   HISTORY OF PRESENTING ILLNESS:  Angela Deleon 61 y.o. female with pmh of fibromyalgia, hypertension and depression was seen in medical oncology to discuss treatment for right-sided breast cancer.  Patient was accompanied today by his fiance.  She is very anxious about the diagnosis which is understandable.  She felt a palpable lump in her right breast which led to further workup.  She has tenderness and bruising from recent biopsy.  She is using ice packs.  Denies any shortness of breath, chest pain, fever, chills, breast discharge.   I have reviewed her chart and materials related to her cancer extensively and collaborated history with the patient. Summary of oncologic history is as follows: Oncology History  Breast cancer ([place])  07/15/2022 Initial Diagnosis   Patient noticed a palpable lump in the right upper outer quadrant of the breast.  Last mammogram was in 2007.   07/27/2022 Mammogram   Diagnostic mammogram and US  IMPRESSION: 1. Highly suspicious mass in the RIGHT breast at  the 11 o'clock axis, 3 cm from the nipple, measuring 1.9 cm, corresponding to the patient's palpable area of concern. Ultrasound-guided biopsy is recommended. 2. Several additional hypoechoic areas adjacent to the dominant RIGHT breast mass, suspected satellite masses, most peripheral component located at the 10 o'clock axis, 5 cm from the  nipple, measuring 1 cm. Ultrasound-guided biopsy is recommended this most peripheral hypoechoic area/satellite mass. 3. No evidence of malignancy within the LEFT breast.   08/13/2022 Pathology Results   DIAGNOSIS: A.  BREAST, RIGHT 10:00 5 CMFN (HEART); ULTRASOUND-GUIDED BIOPSY: - INVASIVE MAMMARY CARCINOMA, NO SPECIAL TYPE. Size of invasive carcinoma: 6 mm in this sample Histologic grade of invasive carcinoma: Grade 1                      Glandular/tubular differentiation score: 2                      Nuclear pleomorphism score: 2                      Mitotic rate score: 1                      Total score: 5 Ductal carcinoma in situ: Not identified Lymphovascular invasion: Not identified ER/PR/HER2: Immunohistochemistry will be performed on block A1, with reflex to [place] for HER2 2+. The results will be reported in an addendum.  B.  BREAST, RIGHT 11:00 3 CMFN (VENUS); ULTRASOUND-GUIDED BIOPSY: - INVASIVE MAMMARY CARCINOMA, NO SPECIAL TYPE, INVOLVING ORGANIZING FAT NECROSIS. Size of invasive carcinoma: 12 mm in this sample Histologic grade of invasive carcinoma: Grade 2                      Glandular/tubular differentiation score: 3                      Nuclear pleomorphism score: 2                      Mitotic rate score: 1                      Total score: 6 Ductal carcinoma in situ: Not identified Lymphovascular invasion: Not identified   ADDENDUM:  CASE SUMMARY: BREAST BIOMARKER TESTS - PART A: RIGHT 10:00 5 CMFN  (HEART)  Estrogen Receptor (ER) Status: POSITIVE          Percentage of cells with nuclear positivity: Greater than 90%          Average intensity of staining: Strong   Progesterone Receptor (PgR) Status: POSITIVE          Percentage of cells with nuclear positivity: 51-90%          Average intensity of staining: Moderate   HER2 (by immunohistochemistry): NEGATIVE (Score 0)   Ki-67: Not performed   CASE SUMMARY: BREAST BIOMARKER TESTS - PART B: RIGHT 11:00  3 CMFN (VENUS) Estrogen Receptor (ER) Status: POSITIVE         Percentage of cells with nuclear positivity: Greater than 90%         Average intensity of staining: Strong  Progesterone Receptor (PgR) Status: POSITIVE         Percentage of cells with nuclear positivity: 11-50%         Average intensity of staining: Moderate  HER2 (by immunohistochemistry): NEGATIVE (Score 0)    08/13/2022 Cancer Staging  Staging form: Breast, AJCC 8th Edition - Clinical stage from 08/13/2022: Stage IA (cT1c, cN0, cM0, G2, ER+, PR+, HER2-) - Signed by Jane Canary, MD on 08/17/2022 Stage prefix: Initial diagnosis Histologic grading system: 3 grade system    Menarche age 56 Age at first birth 74 Birth control yes in 61s Menopause age 71 HRT no  MEDICAL HISTORY:  Past Medical History:  Diagnosis Date   Acid reflux    Anxiety    Depression    Fibromyalgia    Hypertension     SURGICAL HISTORY: Past Surgical History:  Procedure Laterality Date   BREAST BIOPSY Right    2006? neg   BREAST BIOPSY Right 08/13/2022   rt br u/s bx 11:00  venus clip path pending   breast biopsy Right 08/13/2022   rt [place] bx heart 10:00 path pending   BREAST BIOPSY Right 08/13/2022   [place] RT BREAST BX W LOC DEV 1ST LESION IMG BX SPEC US GUIDE 08/13/2022 [facility]   BREAST BIOPSY Right 08/13/2022   [place] RT BREAST BX W LOC DEV EA ADD LESION IMG BX SPEC US GUIDE 08/13/2022 [facility]   THROAT SURGERY      SOCIAL HISTORY: Social History   Socioeconomic History   Marital status: Single    Spouse name: Not on file   Number of children: Not on file   Years of education: Not on file   Highest education level: Not on file  Occupational History   Not on file  Tobacco Use   Smoking status: Every Day    Types: Cigarettes   Smokeless tobacco: Never  Vaping Use   Vaping Use: Never used  Substance and Sexual Activity   Alcohol use: Not Currently    Comment: social drinker   Drug use: Yes    Types:  Marijuana   Sexual activity: Yes    Birth control/protection: None  Other Topics Concern   Not on file  Social History Narrative   Not on file   Social Determinants of Health   Financial Resource Strain: Not on file  Food Insecurity: Not on file  Transportation Needs: Not on file  Physical Activity: Not on file  Stress: Not on file  Social Connections: Not on file  Intimate Partner Violence: Not on file    FAMILY HISTORY: Family History  Problem Relation Age of Onset   Breast cancer Mother 9   Ovarian cancer Mother    Ovarian cancer Maternal Aunt    Ovarian cancer Maternal Aunt    Ovarian cancer Maternal Grandmother     ALLERGIES:  is allergic to iodinated contrast media and penicillins.  MEDICATIONS:  Current Outpatient Medications  Medication Sig Dispense Refill   atorvastatin (LIPITOR) 20 MG tablet Take 20 mg by mouth daily.     benzonatate (TESSALON) 100 MG capsule Take by mouth.     buPROPion ER (WELLBUTRIN SR) 100 MG 12 hr tablet Take 100 mg by mouth every morning.     clonazePAM (KLONOPIN) 0.5 MG tablet Take 0.5 mg by mouth.     escitalopram (LEXAPRO) 10 MG tablet Take 10 mg by mouth daily.     escitalopram (LEXAPRO) 20 MG tablet Take by mouth.     famotidine (PEPCID) 20 MG tablet Take by mouth.     gabapentin (NEURONTIN) 300 MG capsule Take by mouth.     ibuprofen (ADVIL) 600 MG tablet Take 1 tablet (600 mg total) by mouth every 6 (six) hours as needed. 30 tablet 0   lisinopril-hydrochlorothiazide (  ZESTORETIC) 20-12.5 MG tablet Take 1 tablet by mouth daily. 30 tablet 1   omeprazole (PRILOSEC) 20 MG capsule Take 1 capsule (20 mg total) by mouth daily. 30 capsule 1   pantoprazole (PROTONIX) 20 MG tablet Take 1 tablet by mouth daily.     PAXLOVID, 300/100, 20 x 150 MG & 10 x 100MG TBPK Take by mouth as directed.     QUEtiapine (SEROQUEL) 50 MG tablet Take 50 mg by mouth. 1 and 1.5 mg     baclofen (LIORESAL) 10 MG tablet Take 1 tablet (10 mg total) by mouth 3  (three) times daily. (Patient not taking: Reported on 08/18/2022) 30 each 0   predniSONE (STERAPRED UNI-PAK 21 TAB) 10 MG (21) TBPK tablet Take 6 tablets on day 1, 5 tablets day 2, 4 tablets day 3, 3 tablets day 4, 2 tablets day 5, 1 tablet day 6 (Patient not taking: Reported on 08/18/2022) 21 tablet 0   SUMAtriptan (IMITREX) 50 MG tablet Take 1 tablet (50 mg total) by mouth once for 1 dose. May repeat in 2 hours if headache persists or recurs. (Patient not taking: Reported on 08/18/2022) 10 tablet 0   No current facility-administered medications for this visit.    REVIEW OF SYSTEMS:   Pertinent information mentioned in HPI All other systems were reviewed with the patient and are negative.  PHYSICAL EXAMINATION: ECOG PERFORMANCE STATUS: 0 - Asymptomatic  Vitals:   08/18/22 0917  BP: (!) 153/54  Pulse: 64  Resp: 19  Temp: 97.6 F (36.4 C)  SpO2: 97%   Filed Weights   08/18/22 0917  Weight: 183 lb 14.4 oz (83.4 kg)    GENERAL:alert, no distress and comfortable SKIN: skin color, texture, turgor are normal, no rashes or significant lesions EYES: normal, conjunctiva are pink and non-injected, sclera clear OROPHARYNX:no exudate, no erythema and lips, buccal mucosa, and tongue normal  NECK: supple, thyroid normal size, non-tender, without nodularity LYMPH:  no palpable lymphadenopathy in the cervical, axillary or inguinal LUNGS: clear to auscultation and percussion with normal breathing effort HEART: regular rate & rhythm and no murmurs and no lower extremity edema ABDOMEN:abdomen soft, non-tender and normal bowel sounds Musculoskeletal:no cyanosis of digits and no clubbing  PSYCH: alert & oriented x 3 with fluent speech NEURO: no focal motor/sensory deficits  LABORATORY DATA:  I have reviewed the data as listed Lab Results  Component Value Date   WBC 7.5 01/21/2022   HGB 14.0 01/21/2022   HCT 42.7 01/21/2022   MCV 84.4 01/21/2022   PLT 242 01/21/2022   Recent Labs     01/21/22 1301  NA 135  K 3.9  CL 104  CO2 24  GLUCOSE 98  BUN 14  CREATININE 0.85  CALCIUM 8.8*  GFRNONAA >60    RADIOGRAPHIC STUDIES: I have personally reviewed the radiological images as listed and agreed with the findings in the report. [place] RT BREAST BX W LOC DEV EA ADD LESION IMG BX SPEC US GUIDE  Addendum Date: 08/17/2022   ADDENDUM REPORT: 08/17/2022 14:29 ADDENDUM: PATHOLOGY revealed: Site A. BREAST, RIGHT 10:00 5 CMFN (HEART); ULTRASOUND-GUIDED BIOPSY: - INVASIVE MAMMARY CARCINOMA, NO SPECIAL TYPE. Size of invasive carcinoma: 6 mm in this sample. Grade 1. Ductal carcinoma in situ: Not identified. Lymphovascular invasion: Not identified. Pathology results are CONCORDANT with imaging findings, per Dr. Marin Olp. PATHOLOGY revealed: Site B. BREAST, RIGHT 11:00 3 CMFN (VENUS); ULTRASOUND-GUIDED BIOPSY: - INVASIVE MAMMARY CARCINOMA, NO SPECIAL TYPE, INVOLVING ORGANIZING FAT NECROSIS. Size of invasive carcinoma: 12  mm in this sample. Grade 2. Ductal carcinoma in situ: Not identified. Lymphovascular invasion: Not identified. Pathology results are CONCORDANT with imaging findings, per Dr. Marin Olp. Pathology results and recommendations below were discussed with patient by telephone on 08/14/2022. Patient reported biopsy site within normal limits with slight tenderness at the site. Post biopsy care instructions were reviewed, questions were answered and my direct phone number was provided to patient. Patient was instructed to call [facility] if any concerns or questions arise related to the biopsy. RECOMMENDATION: Surgical and oncological consultation. Request for surgical and oncological consultation relayed to Casper Harrison RN at [facility] by Electa Sniff RN on 08/14/2022. Pathology results reported by Electa Sniff RN on 08/17/2022. Electronically Signed   By: Marin Olp M.D.   On: 08/17/2022 14:29   Result Date: 08/17/2022 CLINICAL DATA:  Patient presents for  ultrasound-guided core needle biopsy of 2 sites right breast targeting a 1.9 cm mass at the 11 o'clock position 3 cm from the nipple and a 1 cm mass at the 10 o'clock position 5 cm from the nipple. EXAM: ULTRASOUND GUIDED RIGHT BREAST CORE NEEDLE BIOPSY COMPARISON:  Previous exam(s). PROCEDURE: I met with the patient and we discussed the procedure of ultrasound-guided biopsy, including benefits and alternatives. We discussed the high likelihood of a successful procedure. We discussed the risks of the procedure, including infection, bleeding, tissue injury, clip migration, and inadequate sampling. Informed written consent was given. The usual time-out protocol was performed immediately prior to the procedure. A. Lesion quadrant: Right upper outer quadrant. Using sterile technique and 1% Lidocaine as local anesthetic, under direct ultrasound visualization, a 12 gauge spring-loaded device was used to perform biopsy of the targeted 1 cm mass at the 10 o'clock position of the right breast 5 cm from the nipple using a inferior to superior approach. At the conclusion of the procedure a heart shaped tissue marker clip was deployed into the biopsy cavity. Follow up 2 view mammogram was performed and dictated separately. B. Lesion quadrant: Right upper outer quadrant. Using sterile technique and 1% Lidocaine as local anesthetic, under direct ultrasound visualization, a 12 gauge spring-loaded device was used to perform biopsy of the targeted 1.9 cm mass at the 11 o'clock position 3 cm from the nipple using a inferior to superior approach. At the conclusion of the procedure a venus shaped tissue marker clip was deployed into the biopsy cavity. Follow up 2 view mammogram was performed and dictated separately. IMPRESSION: Ultrasound guided biopsy of 2 suspicious right breast masses at the 10 o'clock and 11 o'clock position as described. No apparent complications. Electronically Signed: By: Marin Olp M.D. On: 08/13/2022 10:26    [place] RT BREAST BX W LOC DEV 1ST LESION IMG BX SPEC US GUIDE  Addendum Date: 08/17/2022   ADDENDUM REPORT: 08/17/2022 14:29 ADDENDUM: PATHOLOGY revealed: Site A. BREAST, RIGHT 10:00 5 CMFN (HEART); ULTRASOUND-GUIDED BIOPSY: - INVASIVE MAMMARY CARCINOMA, NO SPECIAL TYPE. Size of invasive carcinoma: 6 mm in this sample. Grade 1. Ductal carcinoma in situ: Not identified. Lymphovascular invasion: Not identified. Pathology results are CONCORDANT with imaging findings, per Dr. Marin Olp. PATHOLOGY revealed: Site B. BREAST, RIGHT 11:00 3 CMFN (VENUS); ULTRASOUND-GUIDED BIOPSY: - INVASIVE MAMMARY CARCINOMA, NO SPECIAL TYPE, INVOLVING ORGANIZING FAT NECROSIS. Size of invasive carcinoma: 12 mm in this sample. Grade 2. Ductal carcinoma in situ: Not identified. Lymphovascular invasion: Not identified. Pathology results are CONCORDANT with imaging findings, per Dr. Marin Olp. Pathology results and recommendations below were discussed  with patient by telephone on 08/14/2022. Patient reported biopsy site within normal limits with slight tenderness at the site. Post biopsy care instructions were reviewed, questions were answered and my direct phone number was provided to patient. Patient was instructed to call [facility] if any concerns or questions arise related to the biopsy. RECOMMENDATION: Surgical and oncological consultation. Request for surgical and oncological consultation relayed to Casper Harrison RN at [facility] by Electa Sniff RN on 08/14/2022. Pathology results reported by Electa Sniff RN on 08/17/2022. Electronically Signed   By: Marin Olp M.D.   On: 08/17/2022 14:29   Result Date: 08/17/2022 CLINICAL DATA:  Patient presents for ultrasound-guided core needle biopsy of 2 sites right breast targeting a 1.9 cm mass at the 11 o'clock position 3 cm from the nipple and a 1 cm mass at the 10 o'clock position 5 cm from the nipple. EXAM: ULTRASOUND GUIDED RIGHT BREAST CORE NEEDLE BIOPSY  COMPARISON:  Previous exam(s). PROCEDURE: I met with the patient and we discussed the procedure of ultrasound-guided biopsy, including benefits and alternatives. We discussed the high likelihood of a successful procedure. We discussed the risks of the procedure, including infection, bleeding, tissue injury, clip migration, and inadequate sampling. Informed written consent was given. The usual time-out protocol was performed immediately prior to the procedure. A. Lesion quadrant: Right upper outer quadrant. Using sterile technique and 1% Lidocaine as local anesthetic, under direct ultrasound visualization, a 12 gauge spring-loaded device was used to perform biopsy of the targeted 1 cm mass at the 10 o'clock position of the right breast 5 cm from the nipple using a inferior to superior approach. At the conclusion of the procedure a heart shaped tissue marker clip was deployed into the biopsy cavity. Follow up 2 view mammogram was performed and dictated separately. B. Lesion quadrant: Right upper outer quadrant. Using sterile technique and 1% Lidocaine as local anesthetic, under direct ultrasound visualization, a 12 gauge spring-loaded device was used to perform biopsy of the targeted 1.9 cm mass at the 11 o'clock position 3 cm from the nipple using a inferior to superior approach. At the conclusion of the procedure a venus shaped tissue marker clip was deployed into the biopsy cavity. Follow up 2 view mammogram was performed and dictated separately. IMPRESSION: Ultrasound guided biopsy of 2 suspicious right breast masses at the 10 o'clock and 11 o'clock position as described. No apparent complications. Electronically Signed: By: Marin Olp M.D. On: 08/13/2022 10:26   MM CLIP PLACEMENT RIGHT  Result Date: 08/13/2022 CLINICAL DATA:  Patient is post ultrasound-guided core needle biopsy of 2 right breast masses at the 10 o'clock position 5 cm from the nipple and 11 o'clock position 3 cm from the nipple. EXAM: 3D  DIAGNOSTIC RIGHT MAMMOGRAM POST ULTRASOUND BIOPSY COMPARISON:  Previous exam(s). FINDINGS: 3D Mammographic images were obtained following ultrasound guided biopsy of the targeted mass at the 10 o'clock position and 11 o'clock position of the right breast. The biopsy marking clips are in the expected positions at the 2 sites of biopsy. IMPRESSION: Appropriate positioning of the heart shaped biopsy marking clip at the site of biopsy in the 10 o'clock position 5 cm from the nipple. Appropriate positioning of the venus shaped biopsy marking clip at the site of biopsy in the 11 o'clock position 3 cm from the nipple. Final Assessment: Post Procedure Mammograms for Marker Placement Electronically Signed   By: Marin Olp M.D.   On: 08/13/2022 10:42  MM DIAG BREAST TOMO  BILATERAL  Result Date: 07/27/2022 CLINICAL DATA:  Patient describes a palpable lump with pain in the RIGHT breast. EXAM: DIGITAL DIAGNOSTIC BILATERAL MAMMOGRAM WITH TOMOSYNTHESIS; ULTRASOUND RIGHT BREAST LIMITED TECHNIQUE: Bilateral digital diagnostic mammography and breast tomosynthesis was performed.; Targeted ultrasound examination of the right breast was performed COMPARISON:  Previous exam(s). ACR Breast Density Category b: There are scattered areas of fibroglandular density. FINDINGS: RIGHT breast diagnostic mammogram: There is a spiculated mass within the upper outer quadrant of the RIGHT breast with associated architectural distortion and associated overlying skin/nipple retraction. There are suspected satellite masses within the upper-outer quadrant adjacent/near to the dominant mass, largest measuring approximately 6 mm. Questionable smaller mass within the slightly lower inner quadrant of the RIGHT breast, measuring approximately 1 cm greatest dimension. LEFT breast diagnostic mammogram: No dominant masses, suspicious calcifications or secondary signs of malignancy within the LEFT breast. Targeted ultrasound is performed, showing a  spiculated mass in the RIGHT breast at the 11 o'clock axis, 3 cm from the nipple, measuring 1.9 x 1.4 x 1.8 cm, with associated architectural distortion, with internal vascularity, corresponding to patient's palpable area of concern in the dominant mass seen mammographically. There are several adjacent hypoechoic areas in the RIGHT breast at the 10 o'clock axis, 5 cm from the nipple, most peripheral component measuring 1 x 0.5 x 0.9 cm, suspected satellite masses and corresponding to the mammographic findings. There is an additional oval circumscribed hypoechoic mass in the RIGHT breast at the 5 o'clock axis, 1 cm from the nipple, measuring 1.6 x 0.5 x 0.9 cm, stable mammographically dating back to 2007 confirming benignity, consistent with an incidental benign fibroadenoma. RIGHT axilla was evaluated with ultrasound showing no enlarged or morphologically abnormal lymph nodes. IMPRESSION: 1. Highly suspicious mass in the RIGHT breast at the 11 o'clock axis, 3 cm from the nipple, measuring 1.9 cm, corresponding to the patient's palpable area of concern. Ultrasound-guided biopsy is recommended. 2. Several additional hypoechoic areas adjacent to the dominant RIGHT breast mass, suspected satellite masses, most peripheral component located at the 10 o'clock axis, 5 cm from the nipple, measuring 1 cm. Ultrasound-guided biopsy is recommended this most peripheral hypoechoic area/satellite mass. 3. No evidence of malignancy within the LEFT breast. RECOMMENDATION: Two-site ultrasound-guided biopsies for the RIGHT breast, as detailed above. The ultrasound-guided biopsies will be scheduled at patient's convenience. I have discussed the findings and recommendations with the patient. If applicable, a reminder letter will be sent to the patient regarding the next appointment. BI-RADS CATEGORY  5: Highly suggestive of malignancy. Electronically Signed   By: Franki Cabot M.D.   On: 07/27/2022 14:58  US BREAST LTD UNI RIGHT INC  AXILLA  Result Date: 07/27/2022 CLINICAL DATA:  Patient describes a palpable lump with pain in the RIGHT breast. EXAM: DIGITAL DIAGNOSTIC BILATERAL MAMMOGRAM WITH TOMOSYNTHESIS; ULTRASOUND RIGHT BREAST LIMITED TECHNIQUE: Bilateral digital diagnostic mammography and breast tomosynthesis was performed.; Targeted ultrasound examination of the right breast was performed COMPARISON:  Previous exam(s). ACR Breast Density Category b: There are scattered areas of fibroglandular density. FINDINGS: RIGHT breast diagnostic mammogram: There is a spiculated mass within the upper outer quadrant of the RIGHT breast with associated architectural distortion and associated overlying skin/nipple retraction. There are suspected satellite masses within the upper-outer quadrant adjacent/near to the dominant mass, largest measuring approximately 6 mm. Questionable smaller mass within the slightly lower inner quadrant of the RIGHT breast, measuring approximately 1 cm greatest dimension. LEFT breast diagnostic mammogram: No dominant masses, suspicious calcifications or secondary signs of malignancy  within the LEFT breast. Targeted ultrasound is performed, showing a spiculated mass in the RIGHT breast at the 11 o'clock axis, 3 cm from the nipple, measuring 1.9 x 1.4 x 1.8 cm, with associated architectural distortion, with internal vascularity, corresponding to patient's palpable area of concern in the dominant mass seen mammographically. There are several adjacent hypoechoic areas in the RIGHT breast at the 10 o'clock axis, 5 cm from the nipple, most peripheral component measuring 1 x 0.5 x 0.9 cm, suspected satellite masses and corresponding to the mammographic findings. There is an additional oval circumscribed hypoechoic mass in the RIGHT breast at the 5 o'clock axis, 1 cm from the nipple, measuring 1.6 x 0.5 x 0.9 cm, stable mammographically dating back to 2007 confirming benignity, consistent with an incidental benign fibroadenoma.  RIGHT axilla was evaluated with ultrasound showing no enlarged or morphologically abnormal lymph nodes. IMPRESSION: 1. Highly suspicious mass in the RIGHT breast at the 11 o'clock axis, 3 cm from the nipple, measuring 1.9 cm, corresponding to the patient's palpable area of concern. Ultrasound-guided biopsy is recommended. 2. Several additional hypoechoic areas adjacent to the dominant RIGHT breast mass, suspected satellite masses, most peripheral component located at the 10 o'clock axis, 5 cm from the nipple, measuring 1 cm. Ultrasound-guided biopsy is recommended this most peripheral hypoechoic area/satellite mass. 3. No evidence of malignancy within the LEFT breast. RECOMMENDATION: Two-site ultrasound-guided biopsies for the RIGHT breast, as detailed above. The ultrasound-guided biopsies will be scheduled at patient's convenience. I have discussed the findings and recommendations with the patient. If applicable, a reminder letter will be sent to the patient regarding the next appointment. BI-RADS CATEGORY  5: Highly suggestive of malignancy. Electronically Signed   By: Franki Cabot M.D.   On: 07/27/2022 14:58

## 2022-08-17 NOTE — Progress Notes (Signed)
Appointment scheduled with Dr. Peyton Najjar for Thursday 08/20/22 at 10:45.

## 2022-08-18 ENCOUNTER — Encounter: Payer: Self-pay | Admitting: *Deleted

## 2022-08-18 ENCOUNTER — Inpatient Hospital Stay: Payer: 59

## 2022-08-18 ENCOUNTER — Inpatient Hospital Stay: Payer: 59 | Attending: Internal Medicine | Admitting: Internal Medicine

## 2022-08-18 ENCOUNTER — Encounter: Payer: Self-pay | Admitting: Internal Medicine

## 2022-08-18 VITALS — BP 153/54 | HR 64 | Temp 97.6°F | Resp 19 | Wt 183.9 lb

## 2022-08-18 DIAGNOSIS — Z170 Estrogen receptor positive status [ER+]: Secondary | ICD-10-CM | POA: Diagnosis not present

## 2022-08-18 DIAGNOSIS — C50411 Malignant neoplasm of upper-outer quadrant of right female breast: Secondary | ICD-10-CM | POA: Diagnosis not present

## 2022-08-18 DIAGNOSIS — F419 Anxiety disorder, unspecified: Secondary | ICD-10-CM | POA: Insufficient documentation

## 2022-08-18 DIAGNOSIS — F17210 Nicotine dependence, cigarettes, uncomplicated: Secondary | ICD-10-CM | POA: Diagnosis not present

## 2022-08-18 DIAGNOSIS — Z800 Family history of malignant neoplasm of digestive organs: Secondary | ICD-10-CM

## 2022-08-18 DIAGNOSIS — C50911 Malignant neoplasm of unspecified site of right female breast: Secondary | ICD-10-CM

## 2022-08-18 DIAGNOSIS — I10 Essential (primary) hypertension: Secondary | ICD-10-CM | POA: Insufficient documentation

## 2022-08-18 DIAGNOSIS — Z8041 Family history of malignant neoplasm of ovary: Secondary | ICD-10-CM | POA: Insufficient documentation

## 2022-08-18 DIAGNOSIS — R69 Illness, unspecified: Secondary | ICD-10-CM | POA: Diagnosis not present

## 2022-08-18 DIAGNOSIS — Z803 Family history of malignant neoplasm of breast: Secondary | ICD-10-CM | POA: Insufficient documentation

## 2022-08-18 NOTE — Progress Notes (Signed)
Patient states she doesn't know what's going on or what to expect.

## 2022-08-18 NOTE — Progress Notes (Signed)
Accompanied patient and family to initial medical oncology appointment.   Reviewed Breast Cancer treatment handbook.   Care plan summary given to patient.   Reviewed outreach programs and cancer center services.   

## 2022-08-19 ENCOUNTER — Inpatient Hospital Stay: Payer: 59 | Admitting: Licensed Clinical Social Worker

## 2022-08-19 DIAGNOSIS — C50011 Malignant neoplasm of nipple and areola, right female breast: Secondary | ICD-10-CM

## 2022-08-19 NOTE — Progress Notes (Signed)
[place] [facility]  Initial Assessment   Angela Deleon is a 61 y.o. year old female contacted by phone. Clinical Social Work was referred by medical provider for assessment of psychosocial needs.   SDOH (Social Determinants of Health) assessments performed: Yes SDOH Interventions    Flowsheet Row Clinical Support from 08/19/2022 in [place] [facility] [place] Interventions   Food Insecurity Interventions Other (Comment)  [referral for food assitance, patient is transfering her EBT and Medicaid from [employer] to [place] Referral  Transportation Interventions Patient Resources (Friends/Family), [employer] ([place]. Only), Other (Comment)  [Patient is transfering Medicaid and EBT from [facility], patient also qualifies to [employer] transportation with Medicaid]  Utilities Interventions Intervention Not Indicated  Alcohol Usage Interventions Intervention Not Indicated (Score <7)  Depression Interventions/Treatment  Counseling  [Patient is active with psychiatrist and refused counseling services]  Financial Strain Interventions Intervention Not Indicated, [id number] Referral  Physical Activity Interventions Intervention Not Indicated  Stress Interventions Provide Counseling  [Patient refused counselign services]  Social Connections Interventions Intervention Not Indicated       SDOH Screenings   Food Insecurity: Food Insecurity Present (08/19/2022)  Housing: High Risk (08/19/2022)  Transportation Needs: Unmet Transportation Needs (08/19/2022)  Utilities: Not At Risk (08/19/2022)  Alcohol Screen: Low Risk  (08/19/2022)  Depression (PHQ2-9): Medium Risk (08/19/2022)  Financial Resource Strain: High Risk (08/19/2022)  Physical Activity: Inactive (08/19/2022)  Social Connections: Moderately Isolated (08/19/2022)  Stress: Stress Concern Present (08/19/2022)  Tobacco Use: High Risk (08/18/2022)     Distress Screen  completed: Yes    08/18/2022    9:14 AM  ONCBCN DISTRESS SCREENING  Screening Type Initial Screening  Distress experienced in past week (1-10) 10  Practical problem type Housing      Family/Social Information:  Housing Arrangement: patient lives with partner  [name] [phone]  Family members/support persons in your life? Friends and Presenter, broadcasting concerns: yes, patient does not have consistent transportation, patient is currently pending Medicaid/EBT transfer from [employer] to [facility] Employment: Unemployed Patient would like to apply for supplemental social security disability, CSW will leave [employer] consent form at front desk for patient to sign. Income source: Supported by Family and Friends and No income Financial concerns: Yes, current concerns Type of concern: patient expressed general financial concerns, specifically the desire to find affordable housing.  Patient stated she is on [employer]. Food access concerns: yes, patient expressed food access concerns, referral to [place] for food assistance and [employer] card Religious or spiritual practice: Not known Services Currently in place:  [place]  Coping/ Adjustment to diagnosis: Patient understands treatment plan and what happens next? Yes, pending conversation with oncologist and surgeon about next steps Concerns about diagnosis and/or treatment: Pain or discomfort during procedures, Feelings of anger or sadness, Overwhelmed by information, Afraid of cancer, How I will pay for the services I need, and How will I care for myself Patient reported stressors: Housing, Finances, Depression, Anxiety/ nervousness, Adjusting to my illness, Isolation/ feeling alone, Feeling hopeless, and Facing my mortality Hopes and/or priorities: N/A Patient enjoys  N/A Current coping skills/ strengths: Average or above average intelligence , Capable of independent living ,  Communication skills , Motivation for treatment/growth , and Supportive family/friends     SUMMARY: Current SDOH Barriers:  Financial constraints related to no personal income, financially supported by partner, Limited social support, Transport planner, Housing barriers, Mental Health Concerns ,  Social Isolation, and    Clinical Social Work Clinical Goal(s):  No clinical social work goals at this time  Interventions: Discussed common feeling and emotions when being diagnosed with cancer, and the importance of support during treatment Informed patient of the support team roles and support services at [facility] Provided [place] contact information and encouraged patient to call with any questions or concerns Referred patient to [employer], Food assistance, Designer, jewellery [employer] and Provided patient with information about available resources in the community and [facility]   Follow Up Plan: Patient will contact CSW with any support or resource needs and CSW will leave [facility] consent form for patient to sign to send referral for disability application. Patient stated she is pending transfer of her Medicaid and EBT from [facility] to [place].  Patient expresses desire to find new housing, patient is currently living in a travel trailer, but is in a safe location, however it doe snot have running water, and patient has to enter the home to bath and use the facilities.  Patient stated she has applied for housing and is on waiting list.   Patient verbalizes understanding of plan: Yes    Adelene Amas, LCSW

## 2022-08-20 ENCOUNTER — Encounter: Payer: Self-pay | Admitting: Licensed Clinical Social Worker

## 2022-08-20 DIAGNOSIS — C50011 Malignant neoplasm of nipple and areola, right female breast: Secondary | ICD-10-CM | POA: Diagnosis not present

## 2022-08-20 DIAGNOSIS — Z170 Estrogen receptor positive status [ER+]: Secondary | ICD-10-CM | POA: Diagnosis not present

## 2022-08-20 NOTE — Progress Notes (Signed)
[place] CSW Progress Note  Clinical Production assistant, radio consent form, [facility] event calendar, CARE information  form and ACTA information for  to patient to the address on the chart.  Adelene Amas, LCSW

## 2022-08-21 ENCOUNTER — Other Ambulatory Visit: Payer: Self-pay | Admitting: General Surgery

## 2022-08-21 ENCOUNTER — Ambulatory Visit (INDEPENDENT_AMBULATORY_CARE_PROVIDER_SITE_OTHER): Payer: Medicaid Other | Admitting: Plastic Surgery

## 2022-08-21 ENCOUNTER — Encounter: Payer: Self-pay | Admitting: Plastic Surgery

## 2022-08-21 VITALS — BP 148/75 | HR 66 | Temp 97.9°F | Resp 20 | Ht 61.0 in | Wt 183.8 lb

## 2022-08-21 DIAGNOSIS — C50411 Malignant neoplasm of upper-outer quadrant of right female breast: Secondary | ICD-10-CM | POA: Diagnosis not present

## 2022-08-21 DIAGNOSIS — F329 Major depressive disorder, single episode, unspecified: Secondary | ICD-10-CM

## 2022-08-21 DIAGNOSIS — Z170 Estrogen receptor positive status [ER+]: Secondary | ICD-10-CM

## 2022-08-21 DIAGNOSIS — F17210 Nicotine dependence, cigarettes, uncomplicated: Secondary | ICD-10-CM | POA: Diagnosis not present

## 2022-08-21 DIAGNOSIS — C50911 Malignant neoplasm of unspecified site of right female breast: Secondary | ICD-10-CM

## 2022-08-21 NOTE — Progress Notes (Signed)
Patient ID: Angela Deleon, female    DOB: Mar 17, 1961, 61 y.o.   MRN: [id number]   Chief Complaint  Patient presents with   Consult   Breast Cancer    The patient is a 61 year old female here for consultation for breast reconstruction.  She felt a mass on her right breast which led to a workup and a diagnosis of invasive mammary carcinoma of the right breast.  It is estrogen and progesterone positive and HER2 negative.  According to the notes it appears to be located in the upper outer quadrant and under the nipple of the right breast.  The patient has a history of reflux, anxiety and depression, fibromyalgia and hypertension.  She is 5 feet 1 inches tall and weighs 183 pounds.  The patient is a long-term smoker.  Her current breast size is C/D.  She would like to be around the same size but will need a lift on the left breast.  She is planning on right breast mastectomy.     Review of Systems  Constitutional: Negative.   HENT: Negative.    Eyes: Negative.   Respiratory: Negative.  Negative for chest tightness and shortness of breath.   Cardiovascular: Negative.   Gastrointestinal: Negative.   Endocrine: Negative.   Genitourinary: Negative.   Musculoskeletal: Negative.   Allergic/Immunologic: Negative for immunocompromised state.  Neurological: Negative.     Past Medical History:  Diagnosis Date   Acid reflux    Anxiety    Depression    Fibromyalgia    Hypertension     Past Surgical History:  Procedure Laterality Date   BREAST BIOPSY Right    2006? neg   BREAST BIOPSY Right 08/13/2022   rt br u/s bx 11:00  venus clip path pending   breast biopsy Right 08/13/2022   rt [place] bx heart 10:00 path pending   BREAST BIOPSY Right 08/13/2022   [place] RT BREAST BX W LOC DEV 1ST LESION IMG BX SPEC US GUIDE 08/13/2022 [facility]   BREAST BIOPSY Right 08/13/2022   [place] RT BREAST BX W LOC DEV EA ADD LESION IMG BX SPEC US GUIDE 08/13/2022 [facility]   THROAT SURGERY         Current Outpatient Medications:    atorvastatin (LIPITOR) 20 MG tablet, Take 20 mg by mouth daily., Disp: , Rfl:    clonazePAM (KLONOPIN) 0.5 MG tablet, Take 0.5 mg by mouth., Disp: , Rfl:    gabapentin (NEURONTIN) 300 MG capsule, Take by mouth., Disp: , Rfl:    lisinopril-hydrochlorothiazide (ZESTORETIC) 20-12.5 MG tablet, Take 1 tablet by mouth daily., Disp: 30 tablet, Rfl: 1   omeprazole (PRILOSEC) 20 MG capsule, Take 1 capsule (20 mg total) by mouth daily., Disp: 30 capsule, Rfl: 1   QUEtiapine (SEROQUEL) 50 MG tablet, Take 50 mg by mouth. 1 and 1.5 mg, Disp: , Rfl:    baclofen (LIORESAL) 10 MG tablet, Take 1 tablet (10 mg total) by mouth 3 (three) times daily. (Patient not taking: Reported on 08/18/2022), Disp: 30 each, Rfl: 0   benzonatate (TESSALON) 100 MG capsule, Take by mouth., Disp: , Rfl:    buPROPion ER (WELLBUTRIN SR) 100 MG 12 hr tablet, Take 100 mg by mouth every morning., Disp: , Rfl:    escitalopram (LEXAPRO) 10 MG tablet, Take 10 mg by mouth daily., Disp: , Rfl:    escitalopram (LEXAPRO) 20 MG tablet, Take by mouth., Disp: , Rfl:    famotidine (PEPCID) 20 MG tablet, Take by mouth., Disp: ,  Rfl:    ibuprofen (ADVIL) 600 MG tablet, Take 1 tablet (600 mg total) by mouth every 6 (six) hours as needed., Disp: 30 tablet, Rfl: 0   pantoprazole (PROTONIX) 20 MG tablet, Take 1 tablet by mouth daily., Disp: , Rfl:    PAXLOVID, 300/100, 20 x 150 MG & 10 x 100MG TBPK, Take by mouth as directed., Disp: , Rfl:    predniSONE (STERAPRED UNI-PAK 21 TAB) 10 MG (21) TBPK tablet, Take 6 tablets on day 1, 5 tablets day 2, 4 tablets day 3, 3 tablets day 4, 2 tablets day 5, 1 tablet day 6 (Patient not taking: Reported on 08/18/2022), Disp: 21 tablet, Rfl: 0   SUMAtriptan (IMITREX) 50 MG tablet, Take 1 tablet (50 mg total) by mouth once for 1 dose. May repeat in 2 hours if headache persists or recurs. (Patient not taking: Reported on 08/18/2022), Disp: 10 tablet, Rfl: 0   Objective:   Vitals:    08/21/22 1253  BP: (!) 148/75  Pulse: 66  Resp: 20  Temp: 97.9 F (36.6 C)  SpO2: 95%    Physical Exam Vitals and nursing note reviewed.  Constitutional:      Appearance: Normal appearance.  HENT:     Head: Normocephalic and atraumatic.  Cardiovascular:     Rate and Rhythm: Normal rate.     Pulses: Normal pulses.  Pulmonary:     Effort: Pulmonary effort is normal.  Abdominal:     General: There is no distension.     Palpations: Abdomen is soft.     Tenderness: There is no abdominal tenderness.  Musculoskeletal:        General: No swelling or deformity.  Skin:    General: Skin is warm.     Capillary Refill: Capillary refill takes less than 2 seconds.     Coloration: Skin is not jaundiced.     Findings: No bruising.  Neurological:     Mental Status: She is alert and oriented to person, place, and time.  Psychiatric:        Mood and Affect: Mood normal.        Behavior: Behavior normal.        Thought Content: Thought content normal.        Judgment: Judgment normal.     Assessment & Plan:  Major depressive disorder with current active episode, unspecified depression episode severity, unspecified whether recurrent  Malignant neoplasm of right breast in female, estrogen receptor positive, unspecified site of breast [facility])  The options for reconstruction we explained to the patient / family for breast reconstruction.  There are two general categories of reconstruction.  We can reconstruction a breast with implants or use the patient's own tissue.  These were further discussed as listed.  Breast reconstruction is an optional procedure and eligibility depends on the full spectrum of the health of the patient and any co-morbidities.  More than one surgery is often needed to complete the reconstruction process.  The process can take three to twelve months to complete.  The breasts will not be identical due to many factors such as rib differences, shoulder asymmetry and  treatments such as radiation.  The goal is to get the breasts to look normal and symmetrical in clothes.  Scars are a part of surgery and may fade some in time but will always be present under clothes.  Surgery may be an option on the non-cancer breast to achieve more symmetry.  No matter which procedure is chosen there is  always the risk of complications and even failure of the body to heal.  This could result in no breast.    The options for reconstruction include:  1. Placement of a tissue expander with Acellular dermal matrix. When the expander is the desired size surgery is performed to remove the expander and place an implant.  In some cases the implant can be placed without an expander.  2. Autologous reconstruction can include using a muscle or tissue from another area of the body to create a breast.  3. Combined procedures (ie. latissismus dorsi flap) can be done with an expander / implant placed under the muscle.   The risks, benefits, scars and recovery time were discussed for each of the above. Risks include bleeding, infection, hematoma, seroma, scarring, pain, wound healing complications, flap loss, fat necrosis, capsular contracture, need for implant removal, donor site complications, bulge, hernia, umbilical necrosis, need for urgent reoperation, and need for dressing changes.   The procedure the patient selected / that was best for the patient, was then discussed in further detail.  Total time: 45 minutes. This includes time spent with the patient during the visit as well as time spent before and after the visit reviewing the chart, documenting the encounter, making phone calls and reviewing studies.   We discussed the options as stated above.  The patient is interested in the muscle flap.  But hesitant to wait for reconstruction she wants to think about it over the weekend and we will talk on Monday and she will let me know in the meantime we do have her scheduled for Thursday with Dr.  Windell Moment for a right mastectomy with expander and Flex HD placement.  The above information has been discussed with Dr. Windell Moment.  Pictures were obtained of the patient and placed in the chart with the patient's or guardian's permission.   [place], DO

## 2022-08-24 ENCOUNTER — Telehealth: Payer: Self-pay | Admitting: *Deleted

## 2022-08-24 NOTE — Progress Notes (Signed)
Patient will be in on 12/14 to sign for the [employer] and [employer], she will also bring in letter of support then.

## 2022-08-24 NOTE — Telephone Encounter (Signed)
Auth submitted via [facility] portal for cpt [id number], [id number] under pending case [id number]. Submitted as urgent

## 2022-08-25 ENCOUNTER — Encounter
Admission: RE | Admit: 2022-08-25 | Discharge: 2022-08-25 | Disposition: A | Discharge status: Home / Self Care | Payer: Disability Insurance | Source: Ambulatory Visit | Attending: General Surgery | Admitting: General Surgery

## 2022-08-25 ENCOUNTER — Telehealth: Payer: Self-pay | Admitting: *Deleted

## 2022-08-25 ENCOUNTER — Ambulatory Visit: Payer: Self-pay | Admitting: General Surgery

## 2022-08-25 VITALS — Ht 61.0 in | Wt 184.0 lb

## 2022-08-25 DIAGNOSIS — I10 Essential (primary) hypertension: Secondary | ICD-10-CM

## 2022-08-25 DIAGNOSIS — Z01812 Encounter for preprocedural laboratory examination: Secondary | ICD-10-CM

## 2022-08-25 HISTORY — DX: Essential (primary) hypertension: I10

## 2022-08-25 HISTORY — DX: Unspecified asthma, uncomplicated: J45.909

## 2022-08-25 NOTE — Telephone Encounter (Signed)
Attempted to reach patient as Dr. Marla Roe needs to speak with her before surgery. No answer.

## 2022-08-25 NOTE — Patient Instructions (Addendum)
Your procedure is scheduled on: Thursday, December 14 Report to the Registration Desk on the 1st floor of the [facility] at the time you were given.  REMEMBER: Instructions that are not followed completely may result in serious medical risk, up to and including death; or upon the discretion of your surgeon and anesthesiologist your surgery may need to be rescheduled.  Do not eat or drink after midnight the night before surgery.  No gum chewing, lozengers or hard candies.  TAKE THESE MEDICATIONS THE MORNING OF SURGERY WITH A SIP OF WATER:  Atorvastatin (lipitor) Escitalopram (lexapro) Famotidine (Pepcid) - (take one the night before and one on the morning of surgery - helps to prevent nausea after surgery.) Gabapentin Sertraline (zoloft)  One week prior to surgery: Stop Anti-inflammatories (NSAIDS) such as Advil, Aleve, Ibuprofen, Motrin, Naproxen, Naprosyn and Aspirin based products such as Excedrin, Goodys Powder, BC Powder. Stop ANY OVER THE COUNTER supplements until after surgery. You may however, continue to take Tylenol if needed for pain up until the day of surgery.  No Alcohol for 24 hours before or after surgery.  No Smoking including e-cigarettes for 24 hours prior to surgery.  No chewable tobacco products for at least 6 hours prior to surgery.  No nicotine patches on the day of surgery.  Do not use any "recreational" drugs for at least a week prior to your surgery.  Please be advised that the combination of cocaine and anesthesia may have negative outcomes, up to and including death. If you test positive for cocaine, your surgery will be cancelled.  On the morning of surgery brush your teeth with toothpaste and water, you may rinse your mouth with mouthwash if you wish. Do not swallow any toothpaste or mouthwash.  Use CHG Soap as directed on instruction sheet.  Do not wear jewelry, make-up, hairpins, clips or nail polish.  Do not wear lotions, powders, or perfumes.    Do not shave body from the neck down 48 hours prior to surgery just in case you cut yourself which could leave a site for infection.  Also, freshly shaved skin may become irritated if using the CHG soap.  Contact lenses, hearing aids and dentures may not be worn into surgery.  Do not bring valuables to the hospital. [facility] is not responsible for any missing/lost belongings or valuables.   Notify your doctor if there is any change in your medical condition (cold, fever, infection).  Wear comfortable clothing (specific to your surgery type) to the hospital.  After surgery, you can help prevent lung complications by doing breathing exercises.  Take deep breaths and cough every 1-2 hours. Your doctor may order a device called an Incentive Spirometer to help you take deep breaths.  If you are being admitted to the hospital overnight, leave your suitcase in the car. After surgery it may be brought to your room.  If you are being discharged the day of surgery, you will not be allowed to drive home. You will need a responsible adult (18 years or older) to drive you home and stay with you that night.   If you are taking public transportation, you will need to have a responsible adult (18 years or older) with you. Please confirm with your physician that it is acceptable to use public transportation.   Please call the [facility] at [phone] if you have any questions about these instructions.  Surgery Visitation Policy:  Patients undergoing a surgery or procedure may have two family  members or support persons with them as long as the person is not COVID-19 positive or experiencing its symptoms.   Inpatient Visitation:    Visiting hours are 7 a.m. to 8 p.m. Up to four visitors are allowed at one time in a patient room. The visitors may rotate out with other people during the day. One designated support person (adult) may remain overnight.  Due to an increase in  RSV and influenza rates and associated hospitalizations, children ages 52 and under will not be able to visit patients in [facility]. Masks continue to be strongly recommended.     Preparing for Surgery with CHLORHEXIDINE GLUCONATE (CHG) Soap  Chlorhexidine Gluconate (CHG) Soap  o An antiseptic cleaner that kills germs and bonds with the skin to continue killing germs even after washing  o Used for showering the night before surgery and morning of surgery  Before surgery, you can play an important role by reducing the number of germs on your skin.  CHG (Chlorhexidine gluconate) soap is an antiseptic cleanser which kills germs and bonds with the skin to continue killing germs even after washing.  Please do not use if you have an allergy to CHG or antibacterial soaps. If your skin becomes reddened/irritated stop using the CHG.  1. Shower the NIGHT BEFORE SURGERY and the MORNING OF SURGERY with CHG soap.  2. If you choose to wash your hair, wash your hair first as usual with your normal shampoo.  3. After shampooing, rinse your hair and body thoroughly to remove the shampoo.  4. Use CHG as you would any other liquid soap. You can apply CHG directly to the skin and wash gently with a scrungie or a clean washcloth.  5. Apply the CHG soap to your body only from the neck down. Do not use on open wounds or open sores. Avoid contact with your eyes, ears, mouth, and genitals (private parts). Wash face and genitals (private parts) with your normal soap.  6. Wash thoroughly, paying special attention to the area where your surgery will be performed.  7. Thoroughly rinse your body with warm water.  8. Do not shower/wash with your normal soap after using and rinsing off the CHG soap.  9. Pat yourself dry with a clean towel.  10. Wear clean pajamas to bed the night before surgery.  12. Place clean sheets on your bed the night of your first shower and do not sleep with pets.  13.  Shower again with the CHG soap on the day of surgery prior to arriving at the hospital.  14. Do not apply any deodorants/lotions/powders.  15. Please wear clean clothes to the hospital.

## 2022-08-26 ENCOUNTER — Telehealth: Payer: Self-pay | Admitting: *Deleted

## 2022-08-26 ENCOUNTER — Encounter: Payer: Self-pay | Admitting: Urgent Care

## 2022-08-26 ENCOUNTER — Encounter
Admission: RE | Admit: 2022-08-26 | Discharge: 2022-08-26 | Disposition: A | Discharge status: Home / Self Care | Payer: Medicaid Other | Source: Ambulatory Visit | Attending: General Surgery | Admitting: General Surgery

## 2022-08-26 ENCOUNTER — Telehealth: Payer: Self-pay | Admitting: Physician Assistant

## 2022-08-26 ENCOUNTER — Telehealth (INDEPENDENT_AMBULATORY_CARE_PROVIDER_SITE_OTHER): Payer: Medicaid Other | Admitting: Physician Assistant

## 2022-08-26 DIAGNOSIS — Z01812 Encounter for preprocedural laboratory examination: Secondary | ICD-10-CM

## 2022-08-26 DIAGNOSIS — R001 Bradycardia, unspecified: Secondary | ICD-10-CM | POA: Insufficient documentation

## 2022-08-26 DIAGNOSIS — Z01818 Encounter for other preprocedural examination: Secondary | ICD-10-CM | POA: Insufficient documentation

## 2022-08-26 DIAGNOSIS — I10 Essential (primary) hypertension: Secondary | ICD-10-CM | POA: Diagnosis not present

## 2022-08-26 DIAGNOSIS — Z170 Estrogen receptor positive status [ER+]: Secondary | ICD-10-CM

## 2022-08-26 DIAGNOSIS — C50911 Malignant neoplasm of unspecified site of right female breast: Secondary | ICD-10-CM

## 2022-08-26 LAB — CBC
HCT: 36.1 % (ref 36.0–46.0)
Hemoglobin: 11.8 g/dL — ABNORMAL LOW (ref 12.0–15.0)
MCH: 27.9 pg (ref 26.0–34.0)
MCHC: 32.7 g/dL (ref 30.0–36.0)
MCV: 85.3 fL (ref 80.0–100.0)
Platelets: 239 10*3/uL (ref 150–400)
RBC: 4.23 MIL/uL (ref 3.87–5.11)
RDW: 14 % (ref 11.5–15.5)
WBC: 7.9 10*3/uL (ref 4.0–10.5)
nRBC: 0 % (ref 0.0–0.2)

## 2022-08-26 LAB — BASIC METABOLIC PANEL
Anion gap: 4 — ABNORMAL LOW (ref 5–15)
BUN: 18 mg/dL (ref 6–20)
CO2: 28 mmol/L (ref 22–32)
Calcium: 8.8 mg/dL — ABNORMAL LOW (ref 8.9–10.3)
Chloride: 106 mmol/L (ref 98–111)
Creatinine, Ser: 0.74 mg/dL (ref 0.44–1.00)
GFR, Estimated: >60 mL/min (ref >60)
Glucose, Bld: 92 mg/dL (ref 70–99)
Potassium: 4.1 mmol/L (ref 3.5–5.1)
Sodium: 138 mmol/L (ref 135–145)

## 2022-08-26 MED ORDER — DOXYCYCLINE HYCLATE 100 MG PO TABS: 100.0000 mg | ORAL_TABLET | Freq: Two times a day (BID) | ORAL | 0 refills | Status: DC

## 2022-08-26 MED ORDER — OXYCODONE HCL 5 MG PO TABS: 5.0000 mg | ORAL_TABLET | Freq: Four times a day (QID) | ORAL | 0 refills | Status: DC | PRN

## 2022-08-26 NOTE — Telephone Encounter (Signed)
Pt called in and stated she needed the generic brand called in.  Her insurance will only pay for generic.

## 2022-08-26 NOTE — Telephone Encounter (Signed)
Called patient to let her know about PostOP appointment and she stated she wasn't doing the reconstruction right now.   Message sent to [facility].

## 2022-08-26 NOTE — Progress Notes (Signed)
Referring Provider No referring provider defined for this encounter.   CC: No chief complaint on file.     Angela Deleon is an 61 y.o. female.  HPI: This is a 61 YOF with a history of invasive mammary carcinoma of the right breast. It is estrogen and progesterone positive and HER2 negative . She was scheduled today for pre-op evaluation for upcoming immediate breast reconstruction with Dr. Dillingham. The patient was evaluated via phone today. I verified it was the correct patient and that she is in [place]. She notes that after extensive consideration she would not like to proceed with expander placement. She understands the risks associated with expanders and does not want to have to go through the process of expander fills, and from expander to implant. She additional notes that in the event she needs more treatment for breast cancer she does not want to have to have any additional surgeries. She has discussed this with her family and is confident she does not want to proceed.   Review of Systems  General: negative for fever  Physical Exam    08/25/2022   12:52 PM 08/21/2022   12:53 PM 08/18/2022    9:17 AM  Vitals with BMI  Height 5' 1" 5' 1"   Weight 184 lbs 183 lbs 13 oz 183 lbs 14 oz  BMI 34.78 34.75   Systolic  148 153  Diastolic  75 54  Pulse  66 64    General:  speaking in full sentences in no acute distress  Assessment/Plan  I had a lengthy discussion with the patient today about her upcoming surgery. She has decided that she would not like breast reconstruction. She has though about this thoroughly and would like to be fitted for prosthetic rather than expander and subsequent implant. I understand and respect her decision. I have discussed this with Dr. Dillingham who has reached out to her general surgeon Dr. Cintron Diaz to let him know.     Todd  08/26/2022, 2:13 PM         

## 2022-08-27 ENCOUNTER — Ambulatory Visit: Payer: Medicaid Other | Admitting: Anesthesiology

## 2022-08-27 ENCOUNTER — Other Ambulatory Visit: Payer: Self-pay

## 2022-08-27 ENCOUNTER — Encounter
Admission: RE | Disposition: A | Discharge status: Home / Self Care | Payer: Self-pay | Source: Ambulatory Visit | Attending: General Surgery

## 2022-08-27 ENCOUNTER — Encounter: Payer: Self-pay | Admitting: General Surgery

## 2022-08-27 ENCOUNTER — Observation Stay
Admission: RE | Admit: 2022-08-27 | Discharge: 2022-08-28 | Disposition: A | Discharge status: Home / Self Care | Payer: Medicaid Other | Source: Ambulatory Visit | Attending: General Surgery | Admitting: General Surgery

## 2022-08-27 ENCOUNTER — Observation Stay
Admission: RE | Admit: 2022-08-27 | Discharge: 2022-08-27 | Disposition: A | Discharge status: Home / Self Care | Payer: Medicaid Other | Source: Ambulatory Visit | Attending: General Surgery | Admitting: General Surgery

## 2022-08-27 DIAGNOSIS — C50911 Malignant neoplasm of unspecified site of right female breast: Secondary | ICD-10-CM | POA: Diagnosis not present

## 2022-08-27 DIAGNOSIS — J45909 Unspecified asthma, uncomplicated: Secondary | ICD-10-CM | POA: Insufficient documentation

## 2022-08-27 DIAGNOSIS — F17210 Nicotine dependence, cigarettes, uncomplicated: Secondary | ICD-10-CM | POA: Diagnosis not present

## 2022-08-27 DIAGNOSIS — C50919 Malignant neoplasm of unspecified site of unspecified female breast: Secondary | ICD-10-CM | POA: Diagnosis present

## 2022-08-27 DIAGNOSIS — C50011 Malignant neoplasm of nipple and areola, right female breast: Principal | ICD-10-CM | POA: Insufficient documentation

## 2022-08-27 DIAGNOSIS — Z79899 Other long term (current) drug therapy: Secondary | ICD-10-CM | POA: Diagnosis not present

## 2022-08-27 DIAGNOSIS — Z170 Estrogen receptor positive status [ER+]: Secondary | ICD-10-CM | POA: Diagnosis present

## 2022-08-27 HISTORY — PX: MASTECTOMY W/ SENTINEL NODE BIOPSY: SHX2001

## 2022-08-27 SURGERY — MASTECTOMY WITH SENTINEL LYMPH NODE BIOPSY
Anesthesia: General | Site: Breast | Laterality: Right

## 2022-08-27 MED ORDER — BUPIVACAINE LIPOSOME 1.3 % IJ SUSP
INTRAMUSCULAR | Status: AC
  Filled 2022-08-27: qty 20

## 2022-08-27 MED ORDER — SERTRALINE HCL 50 MG PO TABS
150.0000 mg | ORAL_TABLET | Freq: Every day | ORAL | Status: DC
  Administered 2022-08-28: 150 mg via ORAL
  Filled 2022-08-27: qty 3

## 2022-08-27 MED ORDER — FENTANYL CITRATE (PF) 100 MCG/2ML IJ SOLN
INTRAMUSCULAR | Status: DC | PRN
  Administered 2022-08-27 (×2): 50 ug via INTRAVENOUS

## 2022-08-27 MED ORDER — LIDOCAINE HCL (CARDIAC) PF 100 MG/5ML IV SOSY
PREFILLED_SYRINGE | INTRAVENOUS | Status: DC | PRN
  Administered 2022-08-27: 60 mg via INTRAVENOUS
  Administered 2022-08-27: 100 mg via INTRAVENOUS

## 2022-08-27 MED ORDER — CHLORHEXIDINE GLUCONATE 0.12 % MT SOLN
OROMUCOSAL | Status: AC
  Administered 2022-08-27: 15 mL via OROMUCOSAL
  Filled 2022-08-27: qty 15

## 2022-08-27 MED ORDER — ENOXAPARIN SODIUM 40 MG/0.4ML IJ SOSY
40.0000 mg | PREFILLED_SYRINGE | INTRAMUSCULAR | Status: DC
  Administered 2022-08-28: 40 mg via SUBCUTANEOUS
  Filled 2022-08-27: qty 0.4

## 2022-08-27 MED ORDER — CHLORHEXIDINE GLUCONATE 0.12 % MT SOLN: 15.0000 mL | Freq: Once | OROMUCOSAL | Status: AC

## 2022-08-27 MED ORDER — SUGAMMADEX SODIUM 200 MG/2ML IV SOLN
INTRAVENOUS | Status: DC | PRN
  Administered 2022-08-27: 200 mg via INTRAVENOUS

## 2022-08-27 MED ORDER — ONDANSETRON 4 MG PO TBDP: 4.0000 mg | ORAL_TABLET | Freq: Four times a day (QID) | ORAL | Status: DC | PRN

## 2022-08-27 MED ORDER — LISINOPRIL-HYDROCHLOROTHIAZIDE 20-12.5 MG PO TABS: 1.0000 | ORAL_TABLET | Freq: Every day | ORAL | Status: DC

## 2022-08-27 MED ORDER — CELECOXIB 200 MG PO CAPS
ORAL_CAPSULE | ORAL | Status: AC
  Administered 2022-08-27: 200 mg via ORAL
  Filled 2022-08-27: qty 1

## 2022-08-27 MED ORDER — BUPIVACAINE-EPINEPHRINE 0.25% -1:200000 IJ SOLN
INTRAMUSCULAR | Status: DC | PRN
  Administered 2022-08-27: 50 mL

## 2022-08-27 MED ORDER — ACETAMINOPHEN 500 MG PO TABS
ORAL_TABLET | ORAL | Status: AC
  Administered 2022-08-27: 1000 mg via ORAL
  Filled 2022-08-27: qty 2

## 2022-08-27 MED ORDER — ONDANSETRON HCL 4 MG/2ML IJ SOLN: 4.0000 mg | Freq: Four times a day (QID) | INTRAMUSCULAR | Status: DC | PRN

## 2022-08-27 MED ORDER — ACETAMINOPHEN 10 MG/ML IV SOLN: 1000.0000 mg | Freq: Once | INTRAVENOUS | Status: DC | PRN

## 2022-08-27 MED ORDER — OXYCODONE HCL 5 MG PO TABS
ORAL_TABLET | ORAL | Status: AC
  Administered 2022-08-27: 5 mg via ORAL
  Filled 2022-08-27: qty 1

## 2022-08-27 MED ORDER — BUPIVACAINE-EPINEPHRINE (PF) 0.25% -1:200000 IJ SOLN
INTRAMUSCULAR | Status: AC
  Filled 2022-08-27: qty 30

## 2022-08-27 MED ORDER — FENTANYL CITRATE (PF) 100 MCG/2ML IJ SOLN
25.0000 ug | INTRAMUSCULAR | Status: DC | PRN
  Administered 2022-08-27: 25 ug via INTRAVENOUS

## 2022-08-27 MED ORDER — SUCCINYLCHOLINE CHLORIDE 200 MG/10ML IV SOSY
PREFILLED_SYRINGE | INTRAVENOUS | Status: DC | PRN
  Administered 2022-08-27: 100 mg via INTRAVENOUS

## 2022-08-27 MED ORDER — HYDROCODONE-ACETAMINOPHEN 5-325 MG PO TABS
1.0000 | ORAL_TABLET | ORAL | Status: DC | PRN
  Administered 2022-08-27 – 2022-08-28 (×3): 2 via ORAL
  Filled 2022-08-27 (×3): qty 2

## 2022-08-27 MED ORDER — ONDANSETRON HCL 4 MG/2ML IJ SOLN: 4.0000 mg | Freq: Once | INTRAMUSCULAR | Status: DC | PRN

## 2022-08-27 MED ORDER — ROCURONIUM BROMIDE 100 MG/10ML IV SOLN
INTRAVENOUS | Status: DC | PRN
  Administered 2022-08-27: 20 mg via INTRAVENOUS

## 2022-08-27 MED ORDER — HEPARIN SODIUM (PORCINE) 5000 UNIT/ML IJ SOLN
INTRAMUSCULAR | Status: AC
  Administered 2022-08-27: 5000 [IU] via SUBCUTANEOUS
  Filled 2022-08-27: qty 1

## 2022-08-27 MED ORDER — ACETAMINOPHEN 500 MG PO TABS: 1000.0000 mg | ORAL_TABLET | ORAL | Status: AC

## 2022-08-27 MED ORDER — OXYCODONE HCL 5 MG/5ML PO SOLN: 5.0000 mg | Freq: Once | ORAL | Status: AC | PRN

## 2022-08-27 MED ORDER — MIDAZOLAM HCL 2 MG/2ML IJ SOLN
INTRAMUSCULAR | Status: AC
  Filled 2022-08-27: qty 2

## 2022-08-27 MED ORDER — HYDROCHLOROTHIAZIDE 12.5 MG PO TABS
12.5000 mg | ORAL_TABLET | Freq: Every day | ORAL | Status: DC
  Administered 2022-08-28: 12.5 mg via ORAL
  Filled 2022-08-27: qty 1

## 2022-08-27 MED ORDER — VANCOMYCIN HCL IN DEXTROSE 1-5 GM/200ML-% IV SOLN
INTRAVENOUS | Status: AC
  Administered 2022-08-27: 1000 mg via INTRAVENOUS
  Filled 2022-08-27: qty 200

## 2022-08-27 MED ORDER — IBUPROFEN 400 MG PO TABS
600.0000 mg | ORAL_TABLET | Freq: Every day | ORAL | Status: DC | PRN
  Administered 2022-08-28: 600 mg via ORAL
  Filled 2022-08-27 (×2): qty 2

## 2022-08-27 MED ORDER — CLONAZEPAM 1 MG PO TABS: 1.0000 mg | ORAL_TABLET | Freq: Two times a day (BID) | ORAL | Status: DC | PRN

## 2022-08-27 MED ORDER — QUETIAPINE FUMARATE 25 MG PO TABS
50.0000 mg | ORAL_TABLET | Freq: Every day | ORAL | Status: DC
  Filled 2022-08-27: qty 2

## 2022-08-27 MED ORDER — PHENYLEPHRINE HCL (PRESSORS) 10 MG/ML IV SOLN
INTRAVENOUS | Status: DC | PRN
  Administered 2022-08-27: 80 ug via INTRAVENOUS
  Administered 2022-08-27 (×2): 160 ug via INTRAVENOUS

## 2022-08-27 MED ORDER — LISINOPRIL 20 MG PO TABS
20.0000 mg | ORAL_TABLET | Freq: Every day | ORAL | Status: DC
  Administered 2022-08-28: 20 mg via ORAL
  Filled 2022-08-27: qty 1

## 2022-08-27 MED ORDER — STERILE WATER FOR IRRIGATION IR SOLN
Status: DC | PRN
  Administered 2022-08-27: 1000 mL

## 2022-08-27 MED ORDER — OXYCODONE HCL 5 MG PO TABS: 5.0000 mg | ORAL_TABLET | Freq: Once | ORAL | Status: AC | PRN

## 2022-08-27 MED ORDER — FENTANYL CITRATE (PF) 100 MCG/2ML IJ SOLN
INTRAMUSCULAR | Status: AC
  Administered 2022-08-27: 25 ug via INTRAVENOUS
  Filled 2022-08-27: qty 2

## 2022-08-27 MED ORDER — CELECOXIB 200 MG PO CAPS: 200.0000 mg | ORAL_CAPSULE | ORAL | Status: AC

## 2022-08-27 MED ORDER — LACTATED RINGERS IV SOLN: INTRAVENOUS | Status: DC

## 2022-08-27 MED ORDER — PROPOFOL 10 MG/ML IV BOLUS
INTRAVENOUS | Status: DC | PRN
  Administered 2022-08-27: 50 mg via INTRAVENOUS
  Administered 2022-08-27: 150 mg via INTRAVENOUS

## 2022-08-27 MED ORDER — SODIUM CHLORIDE (PF) 0.9 % IJ SOLN
INTRAMUSCULAR | Status: AC
  Filled 2022-08-27: qty 50

## 2022-08-27 MED ORDER — FENTANYL CITRATE (PF) 100 MCG/2ML IJ SOLN
INTRAMUSCULAR | Status: AC
  Filled 2022-08-27: qty 2

## 2022-08-27 MED ORDER — HYDROMORPHONE HCL 1 MG/ML IJ SOLN
INTRAMUSCULAR | Status: AC
  Filled 2022-08-27: qty 1

## 2022-08-27 MED ORDER — GABAPENTIN 400 MG PO CAPS
400.0000 mg | ORAL_CAPSULE | Freq: Three times a day (TID) | ORAL | Status: DC
  Administered 2022-08-27 – 2022-08-28 (×2): 400 mg via ORAL
  Filled 2022-08-27 (×2): qty 1

## 2022-08-27 MED ORDER — BUPIVACAINE LIPOSOME 1.3 % IJ SUSP: 20.0000 mL | Freq: Once | INTRAMUSCULAR | Status: DC

## 2022-08-27 MED ORDER — HYDROMORPHONE HCL 1 MG/ML IJ SOLN
INTRAMUSCULAR | Status: DC | PRN
  Administered 2022-08-27: 1 mg via INTRAVENOUS

## 2022-08-27 MED ORDER — DEXAMETHASONE SODIUM PHOSPHATE 10 MG/ML IJ SOLN
INTRAMUSCULAR | Status: DC | PRN
  Administered 2022-08-27: 10 mg via INTRAVENOUS

## 2022-08-27 MED ORDER — VANCOMYCIN HCL IN DEXTROSE 1-5 GM/200ML-% IV SOLN: 1000.0000 mg | INTRAVENOUS | Status: AC

## 2022-08-27 MED ORDER — MIDAZOLAM HCL 2 MG/2ML IJ SOLN
INTRAMUSCULAR | Status: DC | PRN
  Administered 2022-08-27 (×2): 2 mg via INTRAVENOUS

## 2022-08-27 MED ORDER — SODIUM CHLORIDE 0.9 % IV SOLN: INTRAVENOUS | Status: DC

## 2022-08-27 MED ORDER — ONDANSETRON HCL 4 MG/2ML IJ SOLN
INTRAMUSCULAR | Status: DC | PRN
  Administered 2022-08-27: 4 mg via INTRAVENOUS

## 2022-08-27 MED ORDER — EPHEDRINE SULFATE (PRESSORS) 50 MG/ML IJ SOLN
INTRAMUSCULAR | Status: DC | PRN
  Administered 2022-08-27: 10 mg via INTRAVENOUS
  Administered 2022-08-27: 5 mg via INTRAVENOUS
  Administered 2022-08-27: 10 mg via INTRAVENOUS

## 2022-08-27 MED ORDER — ORAL CARE MOUTH RINSE: 15.0000 mL | Freq: Once | OROMUCOSAL | Status: AC

## 2022-08-27 MED ORDER — HEPARIN SODIUM (PORCINE) 5000 UNIT/ML IJ SOLN: 5000.0000 [IU] | Freq: Once | INTRAMUSCULAR | Status: AC

## 2022-08-27 MED ORDER — MORPHINE SULFATE (PF) 4 MG/ML IV SOLN
4.0000 mg | INTRAVENOUS | Status: DC | PRN
  Administered 2022-08-27 – 2022-08-28 (×3): 4 mg via INTRAVENOUS
  Filled 2022-08-27 (×3): qty 1

## 2022-08-27 MED ORDER — KETAMINE HCL 10 MG/ML IJ SOLN
INTRAMUSCULAR | Status: DC | PRN
  Administered 2022-08-27: 30 mg via INTRAVENOUS

## 2022-08-27 MED ORDER — FAMOTIDINE 20 MG PO TABS
20.0000 mg | ORAL_TABLET | Freq: Every day | ORAL | Status: DC
  Administered 2022-08-28: 20 mg via ORAL
  Filled 2022-08-27: qty 1

## 2022-08-27 MED ORDER — TECHNETIUM TC 99M TILMANOCEPT KIT
1.1000 | PACK | Freq: Once | INTRAVENOUS | Status: AC | PRN
  Administered 2022-08-27: 1.1 via INTRADERMAL

## 2022-08-27 MED ORDER — ALBUTEROL SULFATE HFA 108 (90 BASE) MCG/ACT IN AERS
INHALATION_SPRAY | RESPIRATORY_TRACT | Status: DC | PRN
  Administered 2022-08-27: 8 via RESPIRATORY_TRACT
  Administered 2022-08-27: 2 via RESPIRATORY_TRACT

## 2022-08-27 MED ORDER — DEXMEDETOMIDINE HCL IN NACL 80 MCG/20ML IV SOLN
INTRAVENOUS | Status: DC | PRN
  Administered 2022-08-27: 8 ug via BUCCAL
  Administered 2022-08-27: 12 ug via BUCCAL

## 2022-08-27 MED ORDER — ESCITALOPRAM OXALATE 10 MG PO TABS: 20.0000 mg | ORAL_TABLET | Freq: Every day | ORAL | Status: DC

## 2022-08-27 MED ORDER — GABAPENTIN 300 MG PO CAPS: 300.0000 mg | ORAL_CAPSULE | ORAL | Status: DC

## 2022-08-27 SURGICAL SUPPLY — 41 items
BINDER BREAST XLRG (GAUZE/BANDAGES/DRESSINGS) IMPLANT
BULB RESERV EVAC DRAIN JP 100C (MISCELLANEOUS) ×2 IMPLANT
CHLORAPREP W/TINT 26 (MISCELLANEOUS) ×1 IMPLANT
COVER PROBE GAMMA FINDER SLV (MISCELLANEOUS) ×1 IMPLANT
DERMABOND ADVANCED .7 DNX12 (GAUZE/BANDAGES/DRESSINGS) ×1 IMPLANT
DRAIN CHANNEL JP 15F RND 16 (MISCELLANEOUS) ×2 IMPLANT
DRAIN JP 15F RND TROCAR (DRAIN) IMPLANT
DRAPE LAPAROTOMY TRNSV 106X77 (MISCELLANEOUS) ×1 IMPLANT
DRSG GAUZE FLUFF 36X18 (GAUZE/BANDAGES/DRESSINGS) ×1 IMPLANT
ELECT REM PT RETURN 9FT ADLT (ELECTROSURGICAL) ×1
ELECTRODE REM PT RTRN 9FT ADLT (ELECTROSURGICAL) ×1 IMPLANT
GAUZE 4X4 16PLY ~~LOC~~+RFID DBL (SPONGE) ×1 IMPLANT
GAUZE SPONGE 4X4 12PLY STRL (GAUZE/BANDAGES/DRESSINGS) ×1 IMPLANT
GLOVE BIO SURGEON STRL SZ 6.5 (GLOVE) ×1 IMPLANT
GLOVE BIOGEL PI IND STRL 6.5 (GLOVE) ×1 IMPLANT
GOWN STRL REUS W/ TWL LRG LVL3 (GOWN DISPOSABLE) ×2 IMPLANT
GOWN STRL REUS W/TWL LRG LVL3 (GOWN DISPOSABLE) ×2
KIT TURNOVER KIT A (KITS) ×1 IMPLANT
LABEL OR SOLS (LABEL) ×1 IMPLANT
MANIFOLD NEPTUNE II (INSTRUMENTS) ×1 IMPLANT
MARGIN MAP 10MM (MISCELLANEOUS) ×1 IMPLANT
PACK BASIN MAJOR ARMC (MISCELLANEOUS) ×1 IMPLANT
PAD ABD DERMACEA PRESS 5X9 (GAUZE/BANDAGES/DRESSINGS) ×1 IMPLANT
SPONGE T-LAP 18X18 ~~LOC~~+RFID (SPONGE) ×2 IMPLANT
SUT ETHILON 3-0 FS-10 30 BLK (SUTURE) ×1
SUT ETHILON 4-0 (SUTURE)
SUT ETHILON 4-0 FS2 18XMFL BLK (SUTURE)
SUT MNCRL 4-0 (SUTURE) ×2
SUT MNCRL 4-0 27XMFL (SUTURE) ×2
SUT SILK 2 0 SH (SUTURE) IMPLANT
SUT SILK 3-0 (SUTURE) ×1 IMPLANT
SUT VIC AB 3-0 54X BRD REEL (SUTURE) ×1 IMPLANT
SUT VIC AB 3-0 BRD 54 (SUTURE) ×1
SUT VIC AB 3-0 SH 27 (SUTURE) ×2
SUT VIC AB 3-0 SH 27X BRD (SUTURE) ×2 IMPLANT
SUT VIC AB 3-0 SH 8-18 (SUTURE) ×1 IMPLANT
SUTURE EHLN 3-0 FS-10 30 BLK (SUTURE) ×1 IMPLANT
SUTURE ETHLN 4-0 FS2 18XMF BLK (SUTURE) IMPLANT
SUTURE MNCRL 4-0 27XMF (SUTURE) ×2 IMPLANT
TRAP FLUID SMOKE EVACUATOR (MISCELLANEOUS) ×1 IMPLANT
WATER STERILE IRR 500ML POUR (IV SOLUTION) ×1 IMPLANT

## 2022-08-27 NOTE — Anesthesia Postprocedure Evaluation (Signed)
Anesthesia Post Note  Patient: Angela Deleon  Procedure(s) Performed: MASTECTOMY WITH SENTINEL LYMPH NODE BIOPSY (Right: Breast)  Patient location during evaluation: PACU Anesthesia Type: General Level of consciousness: awake and alert Pain management: pain level controlled Vital Signs Assessment: post-procedure vital signs reviewed and stable Respiratory status: spontaneous breathing, nonlabored ventilation, respiratory function stable and patient connected to nasal cannula oxygen Cardiovascular status: blood pressure returned to baseline and stable Postop Assessment: no apparent nausea or vomiting Anesthetic complications: no   No notable events documented.   Last Vitals:  Vitals:   08/27/22 1830 08/27/22 1910  BP: (!) 113/55 (!) 113/48  Pulse: 60 (!) 58  Resp: 14 18  Temp: (!) 36.3 C 36.6 C  SpO2: 99% 99%    Last Pain:  Vitals:   08/27/22 1910  TempSrc:   PainSc: 10-Worst pain ever                 Arita Miss

## 2022-08-27 NOTE — Anesthesia Procedure Notes (Signed)
Procedure Name: Intubation Date/Time: 08/27/2022 3:24 PM  Performed by: Hedda Slade, CRNAPre-anesthesia Checklist: Patient identified, Patient being monitored, Timeout performed, Emergency Drugs available and Suction available Patient Re-evaluated:Patient Re-evaluated prior to induction Oxygen Delivery Method: Circle system utilized Preoxygenation: Pre-oxygenation with 100% oxygen Induction Type: IV induction Ventilation: Mask ventilation without difficulty and Oral airway inserted - appropriate to patient size Laryngoscope Size: 3 and McGraph Grade View: Grade I Tube type: Oral Tube size: 7.0 mm Number of attempts: 1 Airway Equipment and Method: Stylet Placement Confirmation: ETT inserted through vocal cords under direct vision, positive ETCO2 and breath sounds checked- equal and bilateral Secured at: 20 cm Tube secured with: Tape Dental Injury: Teeth and Oropharynx as per pre-operative assessment

## 2022-08-27 NOTE — Anesthesia Preprocedure Evaluation (Addendum)
Anesthesia Evaluation  Patient identified by MRN, date of birth, ID band Patient awake    Reviewed: Allergy & Precautions, NPO status , Patient's Chart, lab work & pertinent test results  History of Anesthesia Complications Negative for: history of anesthetic complications  Airway Mallampati: III   Neck ROM: Full    Dental  (+) Missing, Chipped   Pulmonary asthma , Current Smoker (1/3 ppd) and Patient abstained from smoking.   Pulmonary exam normal breath sounds clear to auscultation       Cardiovascular hypertension, Normal cardiovascular exam Rhythm:Regular Rate:Normal  ECG 08/26/22:  Sinus bradycardia Otherwise normal ECG   Neuro/Psych  PSYCHIATRIC DISORDERS Anxiety Depression    negative neurological ROS     GI/Hepatic ,GERD  ,,  Endo/Other  Obesity   Renal/GU negative Renal ROS     Musculoskeletal  (+)  Fibromyalgia -  Abdominal   Peds  Hematology negative hematology ROS (+)   Anesthesia Other Findings   Reproductive/Obstetrics                             Anesthesia Physical Anesthesia Plan  ASA: 2  Anesthesia Plan: General   Post-op Pain Management:    Induction: Intravenous  PONV Risk Score and Plan: 2 and Ondansetron, Dexamethasone and Treatment may vary due to age or medical condition  Airway Management Planned: LMA  Additional Equipment:   Intra-op Plan:   Post-operative Plan: Extubation in OR  Informed Consent: I have reviewed the patients History and Physical, chart, labs and discussed the procedure including the risks, benefits and alternatives for the proposed anesthesia with the patient or authorized representative who has indicated his/her understanding and acceptance.     Dental advisory given  Plan Discussed with: CRNA  Anesthesia Plan Comments: (Patient consented for risks of anesthesia including but not limited to:  - adverse reactions to  medications - damage to eyes, teeth, lips or other oral mucosa - nerve damage due to positioning  - sore throat or hoarseness - damage to heart, brain, nerves, lungs, other parts of body or loss of life  Informed patient about role of CRNA in peri- and intra-operative care.  Patient voiced understanding.)        Anesthesia Quick Evaluation

## 2022-08-27 NOTE — H&P (Signed)
PATIENT PROFILE: Angela Deleon is a 61 y.o. female who presents to the Clinic for consultation at the request of Lynnda Shields, [place] for evaluation of right breast cancer.   PCP:  [facility]   HISTORY OF PRESENT ILLNESS: Angela Deleon reports she feels a mass on her right breast.  These led to diagnostic mammogram.  Diagnostic mammogram shows a large spiculated mass with satellite lesions.  Ultrasound shows a 1.9 cm mass on the retroareolar region.  No findings on the left breast.  Axillary ultrasound shows no suspicious lymph node on either breast.  Patient endorses having pain and soreness in the right breast.  Upper radiation.  No alleviating or aggravating factors.   Family history of breast cancer: None Family history of other cancers: Mom with colon cancer Menarche: 20 years old Menopause: Early 49s Used OCP: Denies Used estrogen and progesterone therapy: Denies History of Radiation to the chest: None Number of pregnancies: 4 Age of first pregnancies: 86 (ectopic pregnancy)   PROBLEM LIST: Problem List  Date Reviewed: 03/30/2014            Noted    Polyp of vocal cord or larynx 03/30/2014    Vocal cord nodules 12/01/2013    COPD (chronic obstructive pulmonary disease) (CMS-HCC) (Chronic) 12/01/2013      GENERAL REVIEW OF SYSTEMS:    General ROS: negative for - chills, fatigue, fever, weight gain or weight loss Allergy and Immunology ROS: negative for - hives  Hematological and Lymphatic ROS: negative for - bleeding problems or bruising, negative for palpable nodes Endocrine ROS: negative for - heat or cold intolerance, hair changes Respiratory ROS: negative for - cough, shortness of breath or wheezing Cardiovascular ROS: no chest pain or palpitations GI ROS: negative for nausea, vomiting, abdominal pain, diarrhea, constipation Musculoskeletal ROS: negative for - joint swelling or muscle pain Neurological ROS: negative for - confusion, syncope Dermatological ROS: negative for  pruritus and rash Psychiatric: negative for anxiety, depression, difficulty sleeping and memory loss   MEDICATIONS: Current Medications        Current Outpatient Medications  Medication Sig Dispense Refill   albuterol 90 mcg/actuation inhaler Inhale into the lungs every 6 (six) hours as needed       atorvastatin (LIPITOR) 20 MG tablet Take 20 mg by mouth once daily       benzonatate (TESSALON) 100 MG capsule Take by mouth as needed       clonazePAM (KLONOPIN) 1 MG tablet Take 1 mg by mouth 2 (two) times daily       gabapentin (NEURONTIN) 400 MG capsule Take 400 mg by mouth 3 (three) times daily       lisinopriL-hydroCHLOROthiazide (ZESTORETIC) 20-12.5 mg tablet Take 1 tablet by mouth once daily       omeprazole (PRILOSEC) 20 MG DR capsule Take 20 mg by mouth once daily       QUEtiapine (SEROQUEL) 50 MG tablet Take 75 mg by mouth at bedtime       sertraline (ZOLOFT) 100 MG tablet Take 100 mg by mouth once daily       predniSONE (DELTASONE) 5 MG tablet Take 4 tablets ('20mg'$ ) for 2 days, 3 tablets ('15mg'$ ) for 2 days 2 tablets ('10mg'$ ) for 2 days, one tablet ('5mg'$ ) for one day, then stop taking tablets. (Patient not taking: Reported on 08/20/2022) 10 tablet 0    No current facility-administered medications for this visit.        ALLERGIES: Iodinated contrast media and Penicillins   PAST  MEDICAL HISTORY:     Past Medical History:  Diagnosis Date   Anxiety and depression     Asthma without status asthmaticus     Bronchitis     Chronic headache     Edema      rt leg   Fibromyalgia     IBS (irritable bowel syndrome)        PAST SURGICAL HISTORY:      Past Surgical History:  Procedure Laterality Date   LARYNGOSCOPY W/EXCISION &/OR ASPIRATION N/A 11/14/2013    Procedure: LARYNGOSCOPY, DIRECT, OPERATIVE, RIGID, WITH BIOPSY;  Surgeon: Debroah Baller, MD;  Location: [facility];  Service: Otolaryngology Head and Neck;  Laterality: N/A;   EMERGENCY ENDOTRACHEAL INTUBATION    11/14/2013    Procedure: EMERGENCY ENDOTRACHEAL INTUBATION;  Surgeon: Debroah Baller, MD;  Location: [facility];  Service: Otolaryngology Head and Neck;;   CYSTECTOMY Right      Arm -- as a child   ESSURE TUBAL LIGATION          FAMILY HISTORY:      Family History  Problem Relation Age of Onset   Colon cancer Mother     Ovarian cancer Mother     Stroke Maternal Grandfather        SOCIAL HISTORY: Social History           Socioeconomic History   Marital status: Single  Tobacco Use   Smoking status: Every Day      Types: Cigarettes  Substance and Sexual Activity   Alcohol use: Never   Drug use: Yes      Comment: marijuana        PHYSICAL EXAM:    Vitals:    08/20/22 1053  BP: (!) 142/71  Pulse: 64    Body mass index is 34.77 kg/m. Weight: 83.5 kg (184 lb)    GENERAL: Alert, active, oriented x3   HEENT: Pupils equal reactive to light. Extraocular movements are intact. Sclera clear. Palpebral conjunctiva normal red color.Pharynx clear.   NECK: Supple with no palpable mass and no adenopathy.   LUNGS: Sound clear with no rales rhonchi or wheezes.   HEART: Regular rhythm S1 and S2 without murmur.   BREAST: left breast normal without mass, skin or nipple changes or axillary nodes.  Abnormal mass palpable right breast upper outer retroareolar area.  No palpable axillary lymph nodes on the right axillary area   ABDOMEN: Soft and depressible, nontender with no palpable mass, no hepatomegaly.   EXTREMITIES: Well-developed well-nourished symmetrical with no dependent edema.   NEUROLOGICAL: Awake alert oriented, facial expression symmetrical, moving all extremities.   REVIEW OF DATA: I have reviewed the following data today:       No visits with results within 3 Month(s) from this visit.  Latest known visit with results is:  Admission on 11/14/2013, Discharged on 11/15/2013  Component Date Value    WBC (White Blood Cell Co* 11/14/2013 12.9 (H)    RBC  (Red Blood Cell Coun* 11/14/2013 4.73    POC Hemoglobin 11/14/2013 14.5    Hematocrit 11/14/2013 0.43    MCV (Mean Corpuscular Vo* 11/14/2013 90    MCH (Mean Corpuscular He* 11/14/2013 30.7    MCHC (Mean Corpuscular H* 11/14/2013 34.0    Plt (platelets) 11/14/2013 244    RDW-CV (Red Cell Distrib* 11/14/2013 12.7    MPV (Mean Platelet Volum* 11/14/2013 10.8    NRBC (Nucleated Red Bloo* 11/14/2013 0.00    NRBC % (Nucleated Red Bl*  11/14/2013 0.0    Sodium 11/14/2013 138    Potassium 11/14/2013 3.3 (L)    Chloride 11/14/2013 102    Carbon Dioxide (CO2) 11/14/2013 27    Urea Nitrogen (BUN) 11/14/2013 6 (L)    Creatinine 11/14/2013 0.7    Glucose 11/14/2013 100    Calcium 11/14/2013 9.0    Anion Gap (With Potassiu* 11/14/2013 12    BUN/CREA Ratio 11/14/2013 9    Glomerular Filtration Ra* 11/14/2013 >60    Patient Temperature, Ven* 11/14/2013 37.0    FIO2, Venous 11/14/2013     pH, Venous 11/14/2013 7.37    PCO2, Venous 11/14/2013 49    PO2, Venous 11/14/2013 39    Base Excess, Venous 11/14/2013 2    Bicarbonate, Venous 11/14/2013 28    Total CO2, Venous 11/14/2013 30    Hemoglobin, Venous 11/14/2013 15.2    % O2 Hemoglobin, Venous 11/14/2013 71.3    % CO Hemoglobin, Venous 11/14/2013 4.2    % Methemoglobin, Venous 11/14/2013 1.2    Volume % O2, Venous 11/14/2013 15.2    CK, Total (Creatine Kina* 11/14/2013 111    CK- MB (Creatine Kinase,* 11/14/2013 5    Troponin T 11/14/2013 <0.01    Case Report 11/14/2013                     Value:Surgical Pathology                                Case: [id number]                                Authorizing Provider:  Debroah Baller, MD   Collected:           11/14/2013 1450             Ordering Location:     [facility]         Received:            11/14/2013 1732             Pathologist:           Verdis Frederickson, MD                                                           Specimens:   A) - Larynx, Right vocal cord mass                                                                  B) - Larynx, left vocal cord lesion                                                       DIAGNOSIS 11/14/2013  Value:This result contains rich text formatting which cannot be displayed here.   Clinical Information 11/14/2013                     Value:This result contains rich text formatting which cannot be displayed here.   Gross Examination 11/14/2013                     Value:This result contains rich text formatting which cannot be displayed here.   Intraoperative Consultat* 11/14/2013                     Value:This result contains rich text formatting which cannot be displayed here.   Microscopic Examination 11/14/2013                     Value:This result contains rich text formatting which cannot be displayed here.   Additional Documentation 11/14/2013                     Value:This result contains rich text formatting which cannot be displayed here.      ASSESSMENT: Angela Deleon is a 61 y.o. female presenting for consultation for right breast cancer.     Patient was oriented again about the pathology results. Surgical alternatives were discussed with patient including partial vs total mastectomy. Surgical technique and post operative care was discussed with patient. Risk of surgery was discussed with patient including but not limited to: wound infection, seroma, hematoma, brachial plexopathy, mondor's disease (thrombosis of small veins of breast), chronic wound pain, breast lymphedema, altered sensation to the nipple and cosmesis among others.    Due to the size of the mass, the multiple satellite positive masses and the fact that it is involving the areola/nipple complex I discussed with patient recommendation of total mastectomy.   Patient would like to consider immediate reconstruction.  I personally contacted the plastic surgeon for further evaluation and recommendations. After meeting with plastic surgery,  patient decided not to proceed with reconstruction.    Malignant neoplasm involving both nipple and areola of right breast in female, estrogen receptor positive  [C50.011, Z17.0]   PLAN: Right total mastectomy with sentinel lymph node biopsy (03546, [id number])   Patient verbalized understanding, all questions were answered, and were agreeable with the plan outlined above.        Herbert Pun, MD   Electronically signed by Herbert Pun, MD

## 2022-08-27 NOTE — Transfer of Care (Signed)
Immediate Anesthesia Transfer of Care Note  Patient: Leeanne Butters  Procedure(s) Performed: MASTECTOMY WITH SENTINEL LYMPH NODE BIOPSY (Right: Breast)  Patient Location: PACU  Anesthesia Type:General  Level of Consciousness: sedated  Airway & Oxygen Therapy: Patient Spontanous Breathing and Patient connected to face mask oxygen  Post-op Assessment: Report given to RN and Post -op Vital signs reviewed and stable  Post vital signs: Reviewed and stable  Last Vitals:  Vitals Value Taken Time  BP 136/62 08/27/22 1745  Temp 36.3 C 08/27/22 1737  Pulse 65 08/27/22 1749  Resp 16 08/27/22 1749  SpO2 100 % 08/27/22 1749  Vitals shown include unvalidated device data.  Last Pain:  Vitals:   08/27/22 1737  TempSrc:   PainSc: 7       Patients Stated Pain Goal: 1 (09/81/19 1478)  Complications: No notable events documented.

## 2022-08-27 NOTE — Op Note (Signed)
Preoperative diagnosis:  Carcinoma of the right breast.  Postoperative diagnosis: Same.   Procedure: Right total mastectomy.                     Axillary Sentinel Lymph node biopsy  Anesthesia: GETA  Surgeon: Dr. Windell Moment  Wound Classification: Clean  Indications: Patient is a 61 y.o. female who had an abnormal mammogram that on workup with core needle biopsy was found to be multifocal invasive mammary carcinoma. After discussion of alternatives, the patient elected total mastectomy.  Findings: Palpable mass on the lateral periareolar area No gross abnormal clinical lymph nodes  Description of procedure: The patient was brought to the operating room and general anesthesia was induced. A time-out was completed verifying correct patient, procedure, site, positioning, and implant(s) and/or special equipment prior to beginning this procedure. The breast, chest wall, axilla, and upper arm and neck were prepped and draped in the usual sterile fashion.  A skin incision was made that encompassed the nipple-areola complex and the previous biopsy scar and passed in an oblique direction across the breast. Flaps were raised in the avascular plane between subcutaneous tissue and breast tissue from the clavicle superiorly, the sternum medially, the anterior rectus sheath inferiorly, and past the lateral border of the pectoralis major muscle laterally. Hemostasis was achieved in the flaps. Next, the breast tissue and underlying pectoralis fascia were excised from the pectoralis major muscle, progressing from medially to laterally. At the lateral border of the pectoralis major muscle, the breast tissue was swung laterally and a lateral pedicle identified where breast tissue gave way to fat of axilla. The lateral pedicle was incised and the specimen removed.   A hand-held gamma probe was used to identify the location of the hottest spot in the axilla. An incision was made around the caudal axillary  hairline. Dissection was carried down until subdermal facias was advanced. The probe was placed and again, the point of maximal count was found. Dissection continue until nodule was identified. The probe was placed in contact with the node. The node was excised in its entirety.  Two additional hot spot was detected and the node was excised in similar fashion. No additional hot spots were identified. No clinically abnormal nodes were palpated.   The wound was irrigated and hemostasis was achieved. Closed suction drains were brought into the operative field through a separate stab incision and sutured to the skin with a 3-0 nylon suture. The wound was closed with interrupted 3-0 Vicryl to the subcutaneous layer, followed by a subcuticular layer of Monocryl 4-0. The wound was dressed.   The patient tolerated the procedure well and was taken to the postanesthesia care unit in stable condition.    Sentinel Node Biopsy Synoptic Operative Report  Operation performed with curative intent:Yes  Tracer(s) used to identify sentinel nodes in the upfront surgery (non-neoadjuvant) setting (select all that apply):Radioactive Tracer  Tracer(s) used to identify sentinel nodes in the neoadjuvant setting (select all that apply):N/A  All nodes (colored or non-colored) present at the end of a dye-filled lymphatic channel were removed:N/A  All significantly radioactive nodes were removed:Yes  All palpable suspicious nodes were removed:N/A  Biopsy-proven positive nodes marked with clips prior to chemotherapy were identified and removed:N/A  Specimen:  Right Breast                    Sentinel lymph nodes #1, #2, #3  Complications: None  Estimated Blood Loss: 25 mL

## 2022-08-28 ENCOUNTER — Encounter: Payer: Self-pay | Admitting: General Surgery

## 2022-08-28 DIAGNOSIS — Z170 Estrogen receptor positive status [ER+]: Secondary | ICD-10-CM | POA: Diagnosis not present

## 2022-08-28 DIAGNOSIS — J45909 Unspecified asthma, uncomplicated: Secondary | ICD-10-CM | POA: Diagnosis not present

## 2022-08-28 DIAGNOSIS — Z79899 Other long term (current) drug therapy: Secondary | ICD-10-CM | POA: Diagnosis not present

## 2022-08-28 DIAGNOSIS — C50011 Malignant neoplasm of nipple and areola, right female breast: Secondary | ICD-10-CM | POA: Diagnosis not present

## 2022-08-28 DIAGNOSIS — F17210 Nicotine dependence, cigarettes, uncomplicated: Secondary | ICD-10-CM | POA: Diagnosis not present

## 2022-08-28 LAB — BASIC METABOLIC PANEL
Anion gap: 7 (ref 5–15)
BUN: 19 mg/dL (ref 6–20)
CO2: 24 mmol/L (ref 22–32)
Calcium: 8.6 mg/dL — ABNORMAL LOW (ref 8.9–10.3)
Chloride: 106 mmol/L (ref 98–111)
Creatinine, Ser: 0.78 mg/dL (ref 0.44–1.00)
GFR, Estimated: >60 mL/min (ref >60)
Glucose, Bld: 118 mg/dL — ABNORMAL HIGH (ref 70–99)
Potassium: 4.2 mmol/L (ref 3.5–5.1)
Sodium: 137 mmol/L (ref 135–145)

## 2022-08-28 LAB — CBC
HCT: 39.3 % (ref 36.0–46.0)
Hemoglobin: 12.3 g/dL (ref 12.0–15.0)
MCH: 27.6 pg (ref 26.0–34.0)
MCHC: 31.3 g/dL (ref 30.0–36.0)
MCV: 88.1 fL (ref 80.0–100.0)
Platelets: 208 10*3/uL (ref 150–400)
RBC: 4.46 MIL/uL (ref 3.87–5.11)
RDW: 14 % (ref 11.5–15.5)
WBC: 15.6 10*3/uL — ABNORMAL HIGH (ref 4.0–10.5)
nRBC: 0 % (ref 0.0–0.2)

## 2022-08-28 MED ORDER — METHOCARBAMOL 500 MG PO TABS: 500.0000 mg | ORAL_TABLET | Freq: Four times a day (QID) | ORAL | 0 refills | Status: DC

## 2022-08-28 MED ORDER — HYDROCODONE-ACETAMINOPHEN 5-325 MG PO TABS: 1.0000 | ORAL_TABLET | ORAL | 0 refills | Status: DC | PRN

## 2022-08-28 MED ORDER — OXYCODONE-ACETAMINOPHEN 5-325 MG PO TABS: 1.0000 | ORAL_TABLET | ORAL | 0 refills | Status: AC | PRN

## 2022-08-28 NOTE — Progress Notes (Signed)
Angela Deleon to be discharged Home per MD order. Discussed prescriptions and follow up appointments with the patient. Prescriptions given to patient, medication list explained in detail. Patient verbalized understanding.  Allergies as of 08/28/2022       Reactions   Iodinated Contrast Media Swelling   Penicillins Anaphylaxis, Swelling        Medication List     TAKE these medications    albuterol 108 (90 Base) MCG/ACT inhaler Commonly known as: VENTOLIN HFA Inhale 2 puffs into the lungs every 6 (six) hours as needed.   atorvastatin 20 MG tablet Commonly known as: LIPITOR Take 20 mg by mouth daily.   clonazePAM 1 MG tablet Commonly known as: KLONOPIN Take 1 mg by mouth 2 (two) times daily as needed for anxiety.   doxycycline 100 MG tablet Commonly known as: VIBRA-TABS Take 100 mg by mouth 2 (two) times daily.   escitalopram 20 MG tablet Commonly known as: LEXAPRO Take 20 mg by mouth daily.   famotidine 20 MG tablet Commonly known as: PEPCID Take 20 mg by mouth daily.   gabapentin 400 MG capsule Commonly known as: NEURONTIN Take 400 mg by mouth 3 (three) times daily.   ibuprofen 200 MG tablet Commonly known as: ADVIL Take 600 mg by mouth daily as needed.   lisinopril-hydrochlorothiazide 20-12.5 MG tablet Commonly known as: ZESTORETIC Take 1 tablet by mouth daily.   methocarbamol 500 MG tablet Commonly known as: ROBAXIN Take 1 tablet (500 mg total) by mouth 4 (four) times daily.   oxyCODONE-acetaminophen 5-325 MG tablet Commonly known as: Percocet Take 1 tablet by mouth every 4 (four) hours as needed for severe pain.   QUEtiapine 50 MG tablet Commonly known as: SEROQUEL Take 50 mg by mouth at bedtime.   sertraline 100 MG tablet Commonly known as: ZOLOFT Take 150 mg by mouth daily.   SUMAtriptan 50 MG tablet Commonly known as: Imitrex Take 1 tablet (50 mg total) by mouth once for 1 dose. May repeat in 2 hours if headache persists or recurs.         Vitals:   08/28/22 0700 08/28/22 1418  BP: (!) 140/53 (!) 117/39  Pulse: (!) 59 (!) 58  Resp: 19 16  Temp: 98.9 F (37.2 C) 98.1 F (36.7 C)  SpO2: 96% 94%    Skin clean, dry and intact without evidence of skin break down and or skin tears. IV catheter discontinued intact. Site without signs and symptoms of complications. Dressing and pressure applied. Patient denies pain at this time. No complaints noted.  An After Visit Summary was printed and given to the patient. Patient escorted via wheelchair and discharged Home home via private auto.  Fuller Mandril, RN

## 2022-08-28 NOTE — Discharge Instructions (Signed)
  Diet: Resume home heart healthy regular diet.   Activity: No heavy lifting >20 pounds (children, pets, laundry, garbage) or strenuous activity until follow-up, but light activity and walking are encouraged. Do not drive or drink alcohol if taking narcotic pain medications.  Wound care: May shower with soapy water and pat dry (do not rub incisions), but no baths or submerging incision underwater until follow-up. (no swimming)   Continue drain care and charting as instructed   Medications: Resume all home medications. For mild to moderate pain: acetaminophen (Tylenol) or ibuprofen (if no kidney disease). Combining Tylenol with alcohol can substantially increase your risk of causing liver disease. Narcotic pain medications, if prescribed, can be used for severe pain, though may cause nausea, constipation, and drowsiness. Do not combine Tylenol and Norco within a 6 hour period as Norco contains Tylenol. If you do not need the narcotic pain medication, you do not need to fill the prescription.  Call office [phone]) at any time if any questions, worsening pain, fevers/chills, bleeding, drainage from incision site, or other concerns.

## 2022-08-28 NOTE — Plan of Care (Signed)
  Problem: Health Behavior/Discharge Planning: Goal: Ability to manage health-related needs will improve Outcome: Progressing   Problem: Activity: Goal: Risk for activity intolerance will decrease Outcome: Progressing   Problem: Nutrition: Goal: Adequate nutrition will be maintained Outcome: Progressing   Problem: Coping: Goal: Level of anxiety will decrease Outcome: Progressing   Problem: Elimination: Goal: Will not experience complications related to bowel motility Outcome: Progressing Goal: Will not experience complications related to urinary retention Outcome: Progressing   Problem: Safety: Goal: Ability to remain free from injury will improve Outcome: Progressing   Problem: Skin Integrity: Goal: Risk for impaired skin integrity will decrease Outcome: Progressing

## 2022-08-28 NOTE — Discharge Summary (Signed)
Patient ID: Angela Deleon MRN: [id number] DOB/AGE: 1961-06-12 61 y.o.  Admit date: 08/27/2022 Discharge date: 08/28/2022   Discharge Diagnoses:  Principal Problem:   Breast cancer [facility])   Procedures: Right total mastectomy with axillary sentinel lymph node biopsy  Hospital Course: Patient admitted for total mastectomy with sentinel lymph node biopsy.  She has breast cancer on the right breast.  She tolerated the procedure well.  This morning with pain control.  The mastectomy flaps looks healthy with adequate capillary refills.  There is a small bruise on the drain wound and a small bruise on the axillary area.  Otherwise no sign of fluid collection or ischemic tissue.  Physical Exam Vitals reviewed.  Constitutional:      Appearance: Normal appearance.  HENT:     Head: Normocephalic.  Cardiovascular:     Rate and Rhythm: Normal rate and regular rhythm.     Pulses: Normal pulses.     Heart sounds: Normal heart sounds.  Pulmonary:     Effort: Pulmonary effort is normal.     Breath sounds: Normal breath sounds.  Chest:  Breasts:    Right: Absent.     Comments: Mild bruise on the drain wound and axillary area. Healthy skin flaps Abdominal:     General: Abdomen is flat.  Musculoskeletal:        General: No swelling. Normal range of motion.     Cervical back: Normal range of motion.  Skin:    General: Skin is warm.     Capillary Refill: Capillary refill takes less than 2 seconds.  Neurological:     Mental Status: She is alert and oriented to person, place, and time.      Consults: None  Disposition: Discharge disposition: 01-Home or Self Care       Discharge Instructions     Diet - low sodium heart healthy   Complete by: As directed    Increase activity slowly   Complete by: As directed       Allergies as of 08/28/2022       Reactions   Iodinated Contrast Media Swelling   Penicillins Anaphylaxis, Swelling        Medication List     TAKE these  medications    atorvastatin 20 MG tablet Commonly known as: LIPITOR Take 20 mg by mouth daily.   clonazePAM 1 MG tablet Commonly known as: KLONOPIN Take 1 mg by mouth 2 (two) times daily as needed for anxiety.   escitalopram 20 MG tablet Commonly known as: LEXAPRO Take 20 mg by mouth daily.   famotidine 20 MG tablet Commonly known as: PEPCID Take 20 mg by mouth daily.   gabapentin 400 MG capsule Commonly known as: NEURONTIN Take 400 mg by mouth 3 (three) times daily.   HYDROcodone-acetaminophen 5-325 MG tablet Commonly known as: NORCO/VICODIN Take 1-2 tablets by mouth every 4 (four) hours as needed for moderate pain.   ibuprofen 200 MG tablet Commonly known as: ADVIL Take 600 mg by mouth daily as needed.   lisinopril-hydrochlorothiazide 20-12.5 MG tablet Commonly known as: ZESTORETIC Take 1 tablet by mouth daily.   methocarbamol 500 MG tablet Commonly known as: ROBAXIN Take 1 tablet (500 mg total) by mouth 4 (four) times daily.   QUEtiapine 50 MG tablet Commonly known as: SEROQUEL Take 50 mg by mouth at bedtime.   sertraline 100 MG tablet Commonly known as: ZOLOFT Take 150 mg by mouth daily.   SUMAtriptan 50 MG tablet Commonly known as: Imitrex Take 1 tablet (  50 mg total) by mouth once for 1 dose. May repeat in 2 hours if headache persists or recurs.        Follow-up Information     Herbert Pun, MD Follow up in 1 week(s).   Specialty: General Surgery Contact information: [address] [place] [address] [phone]

## 2022-08-28 NOTE — TOC CM/SW Note (Signed)
  Transition of Care [facility]) Screening Note   Patient Details  Name: Yosselin Zoeller Date of Birth: 1960/10/25   Transition of Care [facility]) CM/SW Contact:    Gerilyn Pilgrim, LCSW Phone Number: 08/28/2022, 9:13 AM    Transition of Care Department [facility]) has reviewed patient and no TOC needs have been identified at this time. Pt has discharge orders in. TOC signing off.

## 2022-08-31 ENCOUNTER — Encounter: Payer: Self-pay | Admitting: *Deleted

## 2022-08-31 LAB — SURGICAL PATHOLOGY

## 2022-08-31 NOTE — Progress Notes (Signed)
Oncotype Dx order [id number] submitted online on surgical path [id number].  Follow up appt. For Dr. Loni Muse made for 09/17/22.  Attempted to call patient to give details, had to leave VM.  Will also mail a copy of AVS.

## 2022-09-04 ENCOUNTER — Encounter: Payer: Disability Insurance | Admitting: Physician Assistant

## 2022-09-09 ENCOUNTER — Encounter: Payer: Self-pay | Admitting: Internal Medicine

## 2022-09-14 DIAGNOSIS — Z419 Encounter for procedure for purposes other than remedying health state, unspecified: Secondary | ICD-10-CM | POA: Diagnosis not present

## 2022-09-15 ENCOUNTER — Ambulatory Visit: Payer: Disability Insurance | Admitting: Internal Medicine

## 2022-09-15 NOTE — Progress Notes (Signed)
Spoke with patient about the Angela Deleon and [employer], she will be in on 09/17/2022 to sign paperwork and bring in letter of support.

## 2022-09-17 ENCOUNTER — Inpatient Hospital Stay: Payer: Medicaid Other

## 2022-09-17 ENCOUNTER — Encounter: Payer: Self-pay | Admitting: Licensed Clinical Social Worker

## 2022-09-17 ENCOUNTER — Inpatient Hospital Stay (HOSPITAL_BASED_OUTPATIENT_CLINIC_OR_DEPARTMENT_OTHER): Payer: Medicaid Other | Admitting: Internal Medicine

## 2022-09-17 ENCOUNTER — Encounter: Payer: Self-pay | Admitting: Internal Medicine

## 2022-09-17 ENCOUNTER — Inpatient Hospital Stay: Payer: Medicaid Other | Attending: Internal Medicine | Admitting: Licensed Clinical Social Worker

## 2022-09-17 ENCOUNTER — Ambulatory Visit: Payer: Disability Insurance | Admitting: Internal Medicine

## 2022-09-17 VITALS — BP 148/68 | HR 64 | Temp 98.6°F | Resp 19 | Wt 187.1 lb

## 2022-09-17 DIAGNOSIS — G8918 Other acute postprocedural pain: Secondary | ICD-10-CM | POA: Insufficient documentation

## 2022-09-17 DIAGNOSIS — Z79811 Long term (current) use of aromatase inhibitors: Secondary | ICD-10-CM

## 2022-09-17 DIAGNOSIS — I10 Essential (primary) hypertension: Secondary | ICD-10-CM | POA: Insufficient documentation

## 2022-09-17 DIAGNOSIS — F418 Other specified anxiety disorders: Secondary | ICD-10-CM | POA: Diagnosis not present

## 2022-09-17 DIAGNOSIS — C50411 Malignant neoplasm of upper-outer quadrant of right female breast: Secondary | ICD-10-CM | POA: Insufficient documentation

## 2022-09-17 DIAGNOSIS — M797 Fibromyalgia: Secondary | ICD-10-CM | POA: Diagnosis not present

## 2022-09-17 DIAGNOSIS — Z800 Family history of malignant neoplasm of digestive organs: Secondary | ICD-10-CM | POA: Insufficient documentation

## 2022-09-17 DIAGNOSIS — Z170 Estrogen receptor positive status [ER+]: Secondary | ICD-10-CM

## 2022-09-17 DIAGNOSIS — Z9011 Acquired absence of right breast and nipple: Secondary | ICD-10-CM | POA: Diagnosis not present

## 2022-09-17 DIAGNOSIS — Z8041 Family history of malignant neoplasm of ovary: Secondary | ICD-10-CM | POA: Insufficient documentation

## 2022-09-17 DIAGNOSIS — Z79899 Other long term (current) drug therapy: Secondary | ICD-10-CM | POA: Insufficient documentation

## 2022-09-17 DIAGNOSIS — C50911 Malignant neoplasm of unspecified site of right female breast: Secondary | ICD-10-CM

## 2022-09-17 DIAGNOSIS — F17210 Nicotine dependence, cigarettes, uncomplicated: Secondary | ICD-10-CM | POA: Insufficient documentation

## 2022-09-17 NOTE — Progress Notes (Signed)
[facility] CONSULT NOTE  Patient Care Team: Pcp, No as PCP - General Daiva Huge, RN as Oncology Nurse Navigator  REFERRING PROVIDER: Dr. Lynnda Shields  REASON FOR REFFERAL: newly diagnosed right breast cancer  CANCER STAGING   Cancer Staging  Breast cancer [facility]) Staging form: Breast, AJCC 8th Edition - Pathologic stage from 08/27/2022: Stage IB (pT2, pN1a(sn), cM0, G2, ER+, PR+, HER2-, Oncotype DX score: 5) - Signed by Jane Canary, MD on 09/17/2022 Stage prefix: Initial diagnosis Method of lymph node assessment: Sentinel lymph node biopsy Multigene prognostic tests performed: Oncotype DX Recurrence score range: Less than 11 Histologic grading system: 3 grade system   ASSESSMENT & PLAN:  Angela Deleon 62 y.o. female with pmh of fibromyalgia, hypertension and depression was seen in medical oncology to discuss treatment for right-sided breast cancer.  # Right breast invasive ductal cancer, pathologic stage Ib (pT2N1) ER/PR+, HER2- -self palpable. S/p US guided biopsy on 08/13/2022.  -s/p right-sided mastectomy with SLNB by Dr. Peyton Najjar on 08/27/2022.  Pathology showed 2.8 cm invasive mammary cancer upper outer quadrant, grade 2, 2 smaller foci in the lower outer quadrant measuring 5 mm and 2.5 mm grade 2, margins negative, 1/4 lymph node positive for macrometastatic disease 3.2 mm, ENE negative, no LVI, pathologic stage pT2 N1a   -Oncotype DX testing showed low recurrence score of 5.  So there is no benefit of chemotherapy based on Rx ponder trial in postmenopausal women.  Patient had mastectomy with N1a disease so I think she will not need radiation.  I will make a formal radiation oncology consult to assess.    - After that, patient will get adjuvant endocrine therapy with letrozole with duration of treatment for at least 5 years.  Patient does not qualify for abemaciclib since the tumor is less than 5 cm in size and is less than grade 3.  Side effects were discussed  including but not limited to myalgias, arthralgias, mood changes, hot flashes, thinning of bone.  Patient has baseline hot flashes and fibromyalgia.  So we will closely monitor for any worsening.  Advised to take calcium and vitamin D supplements.  Encouraged weightbearing exercises to help with bone health.  Patient instructions provided.    -Currently, patient is recovering from the surgery.  She has sharp pain and soreness in the surgical area.  I will follow-up with her again in 3 weeks to assess for recovery and then plan to start letrozole.  -DEXA scan to obtain baseline bone health.  # Postoperative pain -Patient completed oxycodone.  She is using ibuprofen total 600 mg in 24 hours as instructed which is not helping with pain.  Advised to add extra strength Tylenol.  If the pain is still not under control, patient advised to call Dr. Deniece Ree office to assess the need for further oxycodone.  # Family history of colon, uterine and ovarian cancer -Seen by genetics today.  [employer]ae gene panel sent.  # Anxiety -Patient follows with [place] [facility].  She is on multiple medications-Klonopin, Lexapro, Wellbutrin, Seroquel.   Orders Placed This Encounter  Procedures   DG Bone Density    Standing Status:   Future    Standing Expiration Date:   09/17/2023    Order Specific Question:   Reason for Exam (SYMPTOM  OR DIAGNOSIS REQUIRED)    Answer:   postmenopausal, AI    Order Specific Question:   Is the patient pregnant?    Answer:   No    Order Specific Question:  Preferred imaging location?    Answer:   [facility]   Ambulatory referral to Radiation Oncology    Referral Priority:   Routine    Referral Type:   Consultation    Referral Reason:   Specialty Services Required    Requested Specialty:   Radiation Oncology    Number of Visits Requested:   1   RTC in 3 weeks for MD visit to discuss about starting letrozole  The total time spent in the appointment was 40 minutes  encounter with patients including review of chart and various tests results, discussions about plan of care and coordination of care plan   All questions were answered. The patient knows to call the clinic with any problems, questions or concerns. No barriers to learning was detected.  Jane Canary, MD 1/4/202410:38 AM   HISTORY OF PRESENTING ILLNESS:  Angela Deleon 62 y.o. female with pmh of fibromyalgia, hypertension and depression was seen in medical oncology to discuss treatment for right-sided breast cancer.  Patient was accompanied today by his fiance.  Patient continues to be very anxious and overwhelmed going through the treatment process.  She has pain on the right side of the chest from the surgery.  She had seroma drained.  It has recurred.  Has follow-up with Dr. Peyton Najjar next week.  She has completed oxycodone.  She is taking ibuprofen total 600 mg as instructed but no improvement in pain.    I have reviewed her chart and materials related to her cancer extensively and collaborated history with the patient. Summary of oncologic history is as follows: Oncology History  Breast cancer ([place])  07/15/2022 Initial Diagnosis   Patient noticed a palpable lump in the right upper outer quadrant of the breast.  Last mammogram was in 2007.   07/27/2022 Mammogram   Diagnostic mammogram and US  IMPRESSION: 1. Highly suspicious mass in the RIGHT breast at the 11 o'clock axis, 3 cm from the nipple, measuring 1.9 cm, corresponding to the patient's palpable area of concern. Ultrasound-guided biopsy is recommended. 2. Several additional hypoechoic areas adjacent to the dominant RIGHT breast mass, suspected satellite masses, most peripheral component located at the 10 o'clock axis, 5 cm from the nipple, measuring 1 cm. Ultrasound-guided biopsy is recommended this most peripheral hypoechoic area/satellite mass. 3. No evidence of malignancy within the LEFT breast.   08/13/2022 Pathology  Results   DIAGNOSIS: A.  BREAST, RIGHT 10:00 5 CMFN (HEART); ULTRASOUND-GUIDED BIOPSY: - INVASIVE MAMMARY CARCINOMA, NO SPECIAL TYPE. Size of invasive carcinoma: 6 mm in this sample Histologic grade of invasive carcinoma: Grade 1                      Glandular/tubular differentiation score: 2                      Nuclear pleomorphism score: 2                      Mitotic rate score: 1                      Total score: 5 Ductal carcinoma in situ: Not identified Lymphovascular invasion: Not identified ER/PR/HER2: Immunohistochemistry will be performed on block A1, with reflex to [place] for HER2 2+. The results will be reported in an addendum.  B.  BREAST, RIGHT 11:00 3 CMFN (VENUS); ULTRASOUND-GUIDED BIOPSY: - INVASIVE MAMMARY CARCINOMA, NO SPECIAL TYPE, INVOLVING ORGANIZING FAT NECROSIS. Size of invasive  carcinoma: 12 mm in this sample Histologic grade of invasive carcinoma: Grade 2                      Glandular/tubular differentiation score: 3                      Nuclear pleomorphism score: 2                      Mitotic rate score: 1                      Total score: 6 Ductal carcinoma in situ: Not identified Lymphovascular invasion: Not identified   ADDENDUM:  CASE SUMMARY: BREAST BIOMARKER TESTS - PART A: RIGHT 10:00 5 CMFN  (HEART)  Estrogen Receptor (ER) Status: POSITIVE          Percentage of cells with nuclear positivity: Greater than 90%          Average intensity of staining: Strong   Progesterone Receptor (PgR) Status: POSITIVE          Percentage of cells with nuclear positivity: 51-90%          Average intensity of staining: Moderate   HER2 (by immunohistochemistry): NEGATIVE (Score 0)   Ki-67: Not performed   CASE SUMMARY: BREAST BIOMARKER TESTS - PART B: RIGHT 11:00 3 CMFN (VENUS) Estrogen Receptor (ER) Status: POSITIVE         Percentage of cells with nuclear positivity: Greater than 90%         Average intensity of staining: Strong  Progesterone  Receptor (PgR) Status: POSITIVE         Percentage of cells with nuclear positivity: 11-50%         Average intensity of staining: Moderate  HER2 (by immunohistochemistry): NEGATIVE (Score 0)    08/27/2022 Definitive Surgery   Right mastectomy with SLNB by Dr. Peyton Najjar  Pathology showed 2.8 cm invasive mammary cancer upper outer quadrant, grade 2, 2 smaller foci in the lower outer quadrant measuring 5 mm and 2.5 mm grade 2, margins negative, 1/4 lymph node positive for macrometastatic disease 3.2 mm, ENE negative, no LVI, pathologic stage pT2 N1a     08/27/2022 Oncotype testing   Oncotype DX recurrence score 5.  No benefit from chemotherapy.    Menarche age 45 Age at first birth 8 Birth control yes in 54s Menopause age 89 HRT no  MEDICAL HISTORY:  Past Medical History:  Diagnosis Date   Acid reflux    Anxiety    Asthma    Breast cancer ([place]) 08/2022   right   Depression    Fibromyalgia    Hypertension, essential     SURGICAL HISTORY: Past Surgical History:  Procedure Laterality Date   BREAST BIOPSY Right    2006? neg   BREAST BIOPSY Right 08/13/2022   rt br u/s bx 11:00  venus clip path pending   BREAST BIOPSY Right 08/13/2022   [place] RT BREAST BX W LOC DEV 1ST LESION IMG BX SPEC US GUIDE 08/13/2022 [facility]   BREAST BIOPSY Right 08/13/2022   [place] RT BREAST BX W LOC DEV EA ADD LESION IMG BX SPEC US GUIDE 08/13/2022 [facility]   COLONOSCOPY     CYST EXCISION Right    arm   DIRECT LARYNGOSCOPY  11/14/2013   MASTECTOMY W/ SENTINEL NODE BIOPSY Right 08/27/2022   Procedure: MASTECTOMY WITH SENTINEL LYMPH NODE BIOPSY;  Surgeon: Herbert Pun, MD;  Location: [facility];  Service: General;  Laterality: Right;   TUBAL LIGATION  1992    SOCIAL HISTORY: Social History   Socioeconomic History   Marital status: Single    Spouse name: Not on file   Number of children: Not on file   Years of education: Not on file   Highest education level: Not on file   Occupational History   Not on file  Tobacco Use   Smoking status: Every Day    Packs/day: 0.50    Types: Cigarettes   Smokeless tobacco: Never  Vaping Use   Vaping Use: Never used  Substance and Sexual Activity   Alcohol use: Not Currently    Comment: social drinker   Drug use: Yes    Types: Marijuana   Sexual activity: Yes    Birth control/protection: Post-menopausal  Other Topics Concern   Not on file  Social History Narrative   Lives with fiance in a camper   Social Determinants of Health   Financial Resource Strain: High Risk (08/19/2022)   Overall Financial Resource Strain (CARDIA)    Difficulty of Paying Living Expenses: Hard  Food Insecurity: No Food Insecurity (08/28/2022)   Hunger Vital Sign    Worried About Running Out of Food in the Last Year: Never true    [place] in the Last Year: Never true  Recent Concern: [place] Present (08/19/2022)   Hunger Vital Sign    Worried About Running Out of Food in the Last Year: Sometimes true    Ran Out of Food in the Last Year: Sometimes true  Transportation Needs: Unmet Transportation Needs (08/28/2022)   PRAPARE - Transportation    Lack of Transportation (Medical): Yes    Lack of Transportation (Non-Medical): Yes  Physical Activity: Inactive (08/19/2022)   Exercise Vital Sign    Days of Exercise per Week: 0 days    Minutes of Exercise per Session: 0 min  Stress: Stress Concern Present (08/19/2022)   [place]    Feeling of Stress : To some extent  Social Connections: Moderately Isolated (08/19/2022)   Social Connection and Isolation Panel [NHANES]    Frequency of Communication with Friends and Family: Three times a week    Frequency of Social Gatherings with Friends and Family: Once a week    Attends Religious Services: Never    Marine scientist or Organizations: No    Attends Archivist Meetings: Never     Marital Status: Living with partner  Intimate Partner Violence: Not At Risk (08/28/2022)   Humiliation, Afraid, Rape, and Kick questionnaire    Fear of Current or Ex-Partner: No    Emotionally Abused: No    Physically Abused: No    Sexually Abused: No    FAMILY HISTORY: Family History  Problem Relation Age of Onset   Colon cancer Mother 58   Ovarian cancer Mother 78   Cancer Brother        unk type   Ovarian cancer Maternal Grandmother    Esophageal cancer Half-Brother     ALLERGIES:  is allergic to iodinated contrast media and penicillins.  MEDICATIONS:  Current Outpatient Medications  Medication Sig Dispense Refill   albuterol (VENTOLIN HFA) 108 (90 Base) MCG/ACT inhaler Inhale 2 puffs into the lungs every 6 (six) hours as needed.     atorvastatin (LIPITOR) 20 MG tablet Take 20 mg by mouth daily.     clonazePAM (KLONOPIN)  1 MG tablet Take 1 mg by mouth 2 (two) times daily as needed for anxiety.     doxycycline (VIBRA-TABS) 100 MG tablet Take 100 mg by mouth 2 (two) times daily.     famotidine (PEPCID) 20 MG tablet Take 20 mg by mouth daily.     gabapentin (NEURONTIN) 400 MG capsule Take 400 mg by mouth 3 (three) times daily.     ibuprofen (ADVIL) 200 MG tablet Take 600 mg by mouth daily as needed.     lisinopril-hydrochlorothiazide (ZESTORETIC) 20-12.5 MG tablet Take 1 tablet by mouth daily. 30 tablet 1   methocarbamol (ROBAXIN) 500 MG tablet Take 1 tablet (500 mg total) by mouth 4 (four) times daily. 15 tablet 0   oxyCODONE-acetaminophen (PERCOCET) 5-325 MG tablet Take 1 tablet by mouth every 4 (four) hours as needed for severe pain. 20 tablet 0   QUEtiapine (SEROQUEL) 50 MG tablet Take 50 mg by mouth at bedtime.     sertraline (ZOLOFT) 100 MG tablet Take 150 mg by mouth daily.     SUMAtriptan (IMITREX) 50 MG tablet Take 1 tablet (50 mg total) by mouth once for 1 dose. May repeat in 2 hours if headache persists or recurs. 10 tablet 0   escitalopram (LEXAPRO) 20 MG tablet  Take 20 mg by mouth daily. (Patient not taking: Reported on 08/27/2022)     No current facility-administered medications for this visit.    REVIEW OF SYSTEMS:   Pertinent information mentioned in HPI All other systems were reviewed with the patient and are negative.  PHYSICAL EXAMINATION: ECOG PERFORMANCE STATUS: 0 - Asymptomatic  Vitals:   09/17/22 0940  BP: (!) 148/68  Pulse: 64  Resp: 19  Temp: 98.6 F (37 C)  SpO2: 98%    Filed Weights   09/17/22 0940  Weight: 187 lb 1.6 oz (84.9 kg)     GENERAL:alert, no distress and comfortable SKIN: skin color, texture, turgor are normal, no rashes or significant lesions EYES: normal, conjunctiva are pink and non-injected, sclera clear OROPHARYNX:no exudate, no erythema and lips, buccal mucosa, and tongue normal  NECK: supple, thyroid normal size, non-tender, without nodularity LYMPH:  no palpable lymphadenopathy in the cervical, axillary or inguinal LUNGS: clear to auscultation and percussion with normal breathing effort HEART: regular rate & rhythm and no murmurs and no lower extremity edema ABDOMEN:abdomen soft, non-tender and normal bowel sounds Musculoskeletal:no cyanosis of digits and no clubbing  PSYCH: alert & oriented x 3 with fluent speech NEURO: no focal motor/sensory deficits  Right breat-mastectomy suture present.  Seroma present on the upper aspect.  No erythema.  Mild discharge at the lateral aspect of the suture line.  LABORATORY DATA:  I have reviewed the data as listed Lab Results  Component Value Date   WBC 15.6 (H) 08/28/2022   HGB 12.3 08/28/2022   HCT 39.3 08/28/2022   MCV 88.1 08/28/2022   PLT 208 08/28/2022   Recent Labs    01/21/22 1301 08/26/22 1111 08/28/22 0644  NA 135 138 137  K 3.9 4.1 4.2  CL 104 106 106  CO2 _0 GLUCOSE 98 92 118*  BUN _1 CREATININE 0.85 0.74 0.78  CALCIUM 8.8* 8.8* 8.6*  GFRNONAA >60 >60 >60    RADIOGRAPHIC STUDIES: I have personally reviewed  the radiological images as listed and agreed with the findings in the report. NM Sentinel Node Inj-No Rpt (Breast)  Result Date: 08/27/2022 Sulfur Colloid was injected by the Nuclear Medicine Technologist for  sentinel lymph node localization.

## 2022-09-17 NOTE — Progress Notes (Signed)
REFERRING PROVIDER: Jane Canary, MD [place],  [place] [address]  PRIMARY PROVIDER:  Pcp, No  PRIMARY REASON FOR VISIT:  1. Malignant neoplasm of right breast in female, estrogen receptor positive, unspecified site of breast ([place])   2. Family history of colon cancer   3. Family history of ovarian cancer      HISTORY OF PRESENT ILLNESS:   [name], a 62 y.o. female, was seen for a [facility] cancer genetics consultation at the request of Dr. Darrall Dears due to a personal and family history of breast cancer.  [name] presents to clinic today to discuss the possibility of a hereditary predisposition to cancer, genetic testing, and to further clarify her future cancer risks, as well as potential cancer risks for family members.   CANCER HISTORY:  In 2023, at the age of 50, [name] was diagnosed with invasive mammary carcinoma of the right breast, ER/PR+ HER2-. The treatment plan includes right mastectomy which was completed on 08/27/2022, adjuvant radiation and adjuvant endocrine therapy.   RISK FACTORS:  Menarche was at age 7.  First live birth at age 5.  OCP use for approximately 5 years.  Ovaries intact: yes.  Hysterectomy: no.  Menopausal status: postmenopausal.  HRT use: 0 years. Colonoscopy: yes;  she reports 10 polyps on her first colonoscopy and a large polyp on her second. Records not available in Epic .   Past Medical History:  Diagnosis Date   Acid reflux    Anxiety    Asthma    Breast cancer ([place]) 08/2022   right   Depression    Fibromyalgia    Hypertension, essential     Past Surgical History:  Procedure Laterality Date   BREAST BIOPSY Right    2006? neg   BREAST BIOPSY Right 08/13/2022   rt br u/s bx 11:00  venus clip path pending   BREAST BIOPSY Right 08/13/2022   [place] RT BREAST BX W LOC DEV 1ST LESION IMG BX SPEC US GUIDE 08/13/2022 [facility]   BREAST BIOPSY Right 08/13/2022   [place] RT BREAST BX W LOC DEV EA ADD LESION IMG BX  SPEC US GUIDE 08/13/2022 [facility]   COLONOSCOPY     CYST EXCISION Right    arm   DIRECT LARYNGOSCOPY  11/14/2013   MASTECTOMY W/ SENTINEL NODE BIOPSY Right 08/27/2022   Procedure: MASTECTOMY WITH SENTINEL LYMPH NODE BIOPSY;  Surgeon: Herbert Pun, MD;  Location: [facility];  Service: General;  Laterality: Right;   TUBAL LIGATION  1992    FAMILY HISTORY:  We obtained a detailed, 4-generation family history.  Significant diagnoses are listed below: Family History  Problem Relation Age of Onset   Colon cancer Mother 68   Ovarian cancer Mother 72   Cancer Brother        unk type   Ovarian cancer Maternal Grandmother    Esophageal cancer Half-Brother    [name] has 3 daughters, 98, 11 and 80. She has 1 full brother and 2 maternal half brothers. Her full brother has cancer in his bones/back, unknown type. Her maternal half brother passed of esophageal cancer at 72.   [name] mother had colon and ovarian cancer recently diagnosed in her 61s, she is living at 69. Patient has 2 maternal aunts and 2 maternal uncles, none had cancer that she is aware of. Maternal grandmother possibly had ovarian cancer and passed over age 32.  [name] has information about her paternal relatives.  [name] is unaware  of previous family history of genetic testing for hereditary cancer risks. There is no reported Ashkenazi Jewish ancestry. There is no known consanguinity.    GENETIC COUNSELING ASSESSMENT: Ms. Shrode is a 62 y.o. female with  a personal history of breast cancer and family history of cancer which is somewhat suggestive of a hereditary cancer syndrome and predisposition to cancer. We, therefore, discussed and recommended the following at today's visit.   DISCUSSION: We discussed that approximately 10% of breast cancer is hereditary. Most cases of hereditary breast cancer are associated with BRCA1/BRCA2 genes, which also increase the risk for ovarian cancer, although there are  other genes associated with hereditary breast cancer as well. Cancers and risks are gene specific. We discussed that testing is beneficial for several reasons including knowing about cancer risks, identifying potential screening and risk-reduction options that may be appropriate, and to understand if other family members could be at risk for cancer and allow them to undergo genetic testing.   We reviewed the characteristics, features and inheritance patterns of hereditary cancer syndromes. We also discussed genetic testing, including the appropriate family members to test, the process of testing, insurance coverage and turn-around-time for results. We discussed the implications of a negative, positive and/or variant of uncertain significant result. We recommended [name] pursue genetic testing for the [facility] Common Hereditary Cancers+RNA gene panel.   Based on [name] personal and family history of cancer, she meets medical criteria for genetic testing. Despite that she meets criteria, she may still have an out of pocket cost. We discussed that if her out of pocket cost for testing is over $100, the laboratory will call and confirm whether she wants to proceed with testing.  If the out of pocket cost of testing is less than $100 she will be billed by the genetic testing laboratory.   PLAN: After considering the risks, benefits, and limitations, [name] provided informed consent to pursue genetic testing and the blood sample was sent to [facility] for analysis of the Common Hereditary Cancers+RNA panel. Results should be available within approximately 2-3 weeks' time, at which point they will be disclosed by telephone to [name], as will any additional recommendations warranted by these results. [name] will receive a summary of her genetic counseling visit and a copy of her results once available. This information will also be available in Epic.   [name] questions were answered to  her satisfaction today. Our contact information was provided should additional questions or concerns arise. Thank you for the referral and allowing [place] to share in the care of your patient.   Faith Rogue, MS, [facility] Genetic Counselor [email or web address] Phone: [phone]  The patient was seen for a total of 25 minutes in face-to-face genetic counseling. Patient was seen with her partner.  Dr. Grayland Ormond was available for discussion regarding this case.   _______________________________________________________________________ For Office Staff:  Number of people involved in session: 3 Was an Intern/ student involved with case: yes; prospective genetic counseling student Camelia Phenes was present for observation.

## 2022-09-17 NOTE — Patient Instructions (Signed)
Letrozole Tablets What is this medication? LETROZOLE (LET roe zole) treats some types of breast cancer. It works by decreasing the amount of estrogen hormone your body makes, which slows or stops breast cancer cells from spreading or growing. This medicine may be used for other purposes; ask your health care provider or pharmacist if you have questions. COMMON BRAND NAME(S): Femara What should I tell my care team before I take this medication? They need to know if you have any of these conditions: High cholesterol Liver disease Osteoporosis (weak bones) An unusual or allergic reaction to letrozole, other medications, foods, dyes, or preservatives Pregnant or trying to get pregnant Breast-feeding How should I use this medication? Take this medication by mouth with a glass of water. You may take it with or without food. Follow the directions on the prescription label. Take your medication at regular intervals. Do not take your medication more often than directed. Do not stop taking except on your care team's advice. Talk to your care team about the use of this medication in children. Special care may be needed. Overdosage: If you think you have taken too much of this medicine contact a poison control center or emergency room at once. NOTE: This medicine is only for you. Do not share this medicine with others. What if I miss a dose? If you miss a dose, take it as soon as you can. If it is almost time for your next dose, take only that dose. Do not take double or extra doses. What may interact with this medication? Do not take this medication with any of the following: Estrogens, like hormone replacement therapy or birth control pills This medication may also interact with the following: Dietary supplements such as androstenedione or DHEA Prasterone Tamoxifen This list may not describe all possible interactions. Give your health care provider a list of all the medicines, herbs,  non-prescription drugs, or dietary supplements you use. Also tell them if you smoke, drink alcohol, or use illegal drugs. Some items may interact with your medicine. What should I watch for while using this medication? Tell your care team if your symptoms do not start to get better or if they get worse. Do not become pregnant while taking this medication or for 3 weeks after stopping it. Women should inform their care team if they wish to become pregnant or think they might be pregnant. There is a potential for serious side effects to an unborn child. Talk to your care team or pharmacist for more information. Do not breast-feed while taking this medication or for 3 weeks after stopping it. This medication may interfere with the ability to have a child. Talk with your care team if you are concerned about your fertility. Using this medication for a long time may increase your risk of low bone mass. Talk to your care team about bone health. You may get drowsy or dizzy. Do not drive, use machinery, or do anything that needs mental alertness until you know how this medication affects you. Do not stand or sit up quickly, especially if you are an older patient. This reduces the risk of dizzy or fainting spells. You may need blood work done while you are taking this medication. What side effects may I notice from receiving this medication? Side effects that you should report to your care team as soon as possible: Allergic reactions--skin rash, itching, hives, swelling of the face, lips, tongue, or throat Side effects that usually do not require medical attention (report to  your care team if they continue or are bothersome): Dizziness Fatigue Headache Hot flashes Joint Pain Swelling of the ankles, hands, or feet This list may not describe all possible side effects. Call your doctor for medical advice about side effects. You may report side effects to FDA at 1-800-FDA-1088. Where should I keep my  medication? Keep out of the reach of children. Store between 15 and 30 degrees C (59 and 86 degrees F). Throw away any unused medication after the expiration date. NOTE: This sheet is a summary. It may not cover all possible information. If you have questions about this medicine, talk to your doctor, pharmacist, or health care provider.  2023 Elsevier/Gold Standard (2005-02-11 00:00:00)

## 2022-09-22 ENCOUNTER — Ambulatory Visit
Admission: RE | Admit: 2022-09-22 | Discharge: 2022-09-22 | Disposition: A | Discharge status: Home / Self Care | Payer: Medicaid Other | Source: Ambulatory Visit | Attending: Radiation Oncology | Admitting: Radiation Oncology

## 2022-09-22 ENCOUNTER — Encounter: Payer: Self-pay | Admitting: Radiation Oncology

## 2022-09-22 VITALS — BP 140/71 | HR 67 | Temp 97.0°F | Resp 18 | Ht 61.0 in | Wt 187.3 lb

## 2022-09-22 DIAGNOSIS — Z8042 Family history of malignant neoplasm of prostate: Secondary | ICD-10-CM | POA: Insufficient documentation

## 2022-09-22 DIAGNOSIS — K219 Gastro-esophageal reflux disease without esophagitis: Secondary | ICD-10-CM | POA: Insufficient documentation

## 2022-09-22 DIAGNOSIS — Z79899 Other long term (current) drug therapy: Secondary | ICD-10-CM | POA: Insufficient documentation

## 2022-09-22 DIAGNOSIS — Z800 Family history of malignant neoplasm of digestive organs: Secondary | ICD-10-CM | POA: Insufficient documentation

## 2022-09-22 DIAGNOSIS — F329 Major depressive disorder, single episode, unspecified: Secondary | ICD-10-CM | POA: Insufficient documentation

## 2022-09-22 DIAGNOSIS — M797 Fibromyalgia: Secondary | ICD-10-CM | POA: Diagnosis not present

## 2022-09-22 DIAGNOSIS — Z170 Estrogen receptor positive status [ER+]: Secondary | ICD-10-CM

## 2022-09-22 DIAGNOSIS — I10 Essential (primary) hypertension: Secondary | ICD-10-CM | POA: Insufficient documentation

## 2022-09-22 DIAGNOSIS — C50911 Malignant neoplasm of unspecified site of right female breast: Secondary | ICD-10-CM | POA: Diagnosis not present

## 2022-09-22 DIAGNOSIS — C779 Secondary and unspecified malignant neoplasm of lymph node, unspecified: Secondary | ICD-10-CM | POA: Insufficient documentation

## 2022-09-22 DIAGNOSIS — F17210 Nicotine dependence, cigarettes, uncomplicated: Secondary | ICD-10-CM | POA: Diagnosis not present

## 2022-09-22 NOTE — Consult Note (Signed)
NEW PATIENT EVALUATION  Name: Angela Deleon  MRN: [id number]  Date:   09/22/2022     DOB: 04-02-1961   This 62 y.o. female patient presents to the clinic for initial evaluation of stage Ib (pT2 pN1a (SN) M0) ER/PR positive HER2 negative invasive mammary carcinoma of the right breast status post right modified radical mastectomy.  REFERRING PHYSICIAN: Jane Canary, MD  CHIEF COMPLAINT:  Chief Complaint  Patient presents with   Breast Cancer    consult    DIAGNOSIS: The encounter diagnosis was Malignant neoplasm involving both nipple and areola of right breast in female, estrogen receptor positive ([place]).   PREVIOUS INVESTIGATIONS:  Ultrasound and mammograms reviewed Pathology reports reviewed Clinical notes reviewed  HPI: Patient is a 62 year old female who presented to self palpated mass in her right breast.  Mammogram confirmed a highly suspicious mass in the right breast at the 11 o'clock position 3 cm from the nipple measuring 1.9 cm.  There were several additional hypoechoic areas adjacent to the dominant right breast mass.  No evidence of malignancy in the left breast and left axilla was within normal limits.  Ultrasound-guided biopsy was positive for overall grade 1 invasive mammary carcinoma ER/PR positive HER2/neu not overexpressed.  1 additional area in the 11 o'clock position of the right breast also was positive for invasive mammary carcinoma.  No lymph vascular invasion was identified.  She underwent a simple mastectomy showing 2.8 cm of invasive mammary carcinoma overall grade 2 with 2 small foci in the lower outer quadrant of grade 2 invasive mammary carcinoma.  All margins were negative.  1 of 4 lymph nodes sentinel nodes contained macro metastatic 3.2 mm area of metastatic carcinoma.  Margins were clear at 15 mm with the closest margin posterior.  Patient has a history of fibromyalgia hypertension and depression.  Oncotype DX showed low recurrence score necessitating the need  for systemic chemotherapy.  She will be on endocrine therapy.  She is seen today for consideration of radiation.  She continues to have some slow healing of her mastectomy scar and some tenderness in her right axilla.  PLANNED TREATMENT REGIMEN: No adjuvant radiation  PAST MEDICAL HISTORY:  has a past medical history of Acid reflux, Anxiety, Asthma, Breast cancer ([place]) (08/2022), Depression, Fibromyalgia, and Hypertension, essential.    PAST SURGICAL HISTORY:  Past Surgical History:  Procedure Laterality Date   BREAST BIOPSY Right    2006? neg   BREAST BIOPSY Right 08/13/2022   rt br u/s bx 11:00  venus clip path pending   BREAST BIOPSY Right 08/13/2022   [place] RT BREAST BX W LOC DEV 1ST LESION IMG BX SPEC US GUIDE 08/13/2022 [facility]   BREAST BIOPSY Right 08/13/2022   [place] RT BREAST BX W LOC DEV EA ADD LESION IMG BX SPEC US GUIDE 08/13/2022 [facility]   COLONOSCOPY     CYST EXCISION Right    arm   DIRECT LARYNGOSCOPY  11/14/2013   MASTECTOMY W/ SENTINEL NODE BIOPSY Right 08/27/2022   Procedure: MASTECTOMY WITH SENTINEL LYMPH NODE BIOPSY;  Surgeon: Herbert Pun, MD;  Location: [facility];  Service: General;  Laterality: Right;   TUBAL LIGATION  1992    FAMILY HISTORY: family history includes Cancer in her brother; Colon cancer (age of onset: 16) in her mother; Esophageal cancer in her half-brother; Ovarian cancer in her maternal grandmother; Ovarian cancer (age of onset: 16) in her mother.  SOCIAL HISTORY:  reports that she has been smoking cigarettes. She has been smoking an average  of .5 packs per day. She has never used smokeless tobacco. She reports that she does not currently use alcohol. She reports current drug use. Drug: Marijuana.  ALLERGIES: Iodinated contrast media and Penicillins  MEDICATIONS:  Current Outpatient Medications  Medication Sig Dispense Refill   albuterol (VENTOLIN HFA) 108 (90 Base) MCG/ACT inhaler Inhale 2 puffs into the lungs every 6  (six) hours as needed.     atorvastatin (LIPITOR) 20 MG tablet Take 20 mg by mouth daily.     clonazePAM (KLONOPIN) 1 MG tablet Take 1 mg by mouth 2 (two) times daily as needed for anxiety.     doxycycline (VIBRA-TABS) 100 MG tablet Take 100 mg by mouth 2 (two) times daily.     escitalopram (LEXAPRO) 20 MG tablet Take 20 mg by mouth daily. (Patient not taking: Reported on 08/27/2022)     famotidine (PEPCID) 20 MG tablet Take 20 mg by mouth daily.     gabapentin (NEURONTIN) 400 MG capsule Take 400 mg by mouth 3 (three) times daily.     ibuprofen (ADVIL) 200 MG tablet Take 600 mg by mouth daily as needed.     lisinopril-hydrochlorothiazide (ZESTORETIC) 20-12.5 MG tablet Take 1 tablet by mouth daily. 30 tablet 1   methocarbamol (ROBAXIN) 500 MG tablet Take 1 tablet (500 mg total) by mouth 4 (four) times daily. 15 tablet 0   oxyCODONE-acetaminophen (PERCOCET) 5-325 MG tablet Take 1 tablet by mouth every 4 (four) hours as needed for severe pain. 20 tablet 0   QUEtiapine (SEROQUEL) 50 MG tablet Take 50 mg by mouth at bedtime.     sertraline (ZOLOFT) 100 MG tablet Take 150 mg by mouth daily.     SUMAtriptan (IMITREX) 50 MG tablet Take 1 tablet (50 mg total) by mouth once for 1 dose. May repeat in 2 hours if headache persists or recurs. 10 tablet 0   No current facility-administered medications for this encounter.    ECOG PERFORMANCE STATUS:  1 - Symptomatic but completely ambulatory  REVIEW OF SYSTEMS: Patient has a history of depression fibromyalgia hypertension. Patient denies any weight loss, fatigue, weakness, fever, chills or night sweats. Patient denies any loss of vision, blurred vision. Patient denies any ringing  of the ears or hearing loss. No irregular heartbeat. Patient denies heart murmur or history of fainting. Patient denies any chest pain or pain radiating to her upper extremities. Patient denies any shortness of breath, difficulty breathing at night, cough or hemoptysis. Patient denies  any swelling in the lower legs. Patient denies any nausea vomiting, vomiting of blood, or coffee ground material in the vomitus. Patient denies any stomach pain. Patient states has had normal bowel movements no significant constipation or diarrhea. Patient denies any dysuria, hematuria or significant nocturia. Patient denies any problems walking, swelling in the joints or loss of balance. Patient denies any skin changes, loss of hair or loss of weight. Patient denies any excessive worrying or anxiety or significant depression. Patient denies any problems with insomnia. Patient denies excessive thirst, polyuria, polydipsia. Patient denies any swollen glands, patient denies easy bruising or easy bleeding. Patient denies any recent infections, allergies or URI. Patient "s visual fields have not changed significantly in recent time.   PHYSICAL EXAM: BP (!) 140/71   Pulse 67   Temp (!) 97 F (36.1 C)   Resp 18   Ht '5\' 1"'$  (1.549 m)   Wt 187 lb 4.8 oz (85 kg)   BMI 35.39 kg/m  Patient status post right modified radical  mastectomy incision is still healing with some still slight open areas in the lateral aspect of the scar.  She also has some scar tissue in her right axilla.  Left breast is free of dominant mass no axillary or supraclavicular adenopathy is appreciated.  Well-developed well-nourished patient in NAD. HEENT reveals PERLA, EOMI, discs not visualized.  Oral cavity is clear. No oral mucosal lesions are identified. Neck is clear without evidence of cervical or supraclavicular adenopathy. Lungs are clear to A&P. Cardiac examination is essentially unremarkable with regular rate and rhythm without murmur rub or thrill. Abdomen is benign with no organomegaly or masses noted. Motor sensory and DTR levels are equal and symmetric in the upper and lower extremities. Cranial nerves II through XII are grossly intact. Proprioception is intact. No peripheral adenopathy or edema is identified. No motor or sensory  levels are noted. Crude visual fields are within normal range.  LABORATORY DATA: Pathology reports reviewed    RADIOLOGY RESULTS: Mammo and ultrasound reviewed   IMPRESSION: Stage Ib invasive mammary carcinoma of the right breast status post right modified radical mastectomy ER/PR positive in 62 year old female  PLAN: At this time based on the lack of poor prognostic factors such as size greater than 4 cm significant lymph node involvement evidence of chest wall or skin involvement I would defer adjuvant radiation therapy.  She has a low-grade tumor hormone receptor positive and would benefit from endocrine therapy.  I have explained all this to the patient and she comprehends my recommendations well.  I would like to take this opportunity to thank you for allowing me to participate in the care of your patient.Noreene Filbert, MD

## 2022-09-29 ENCOUNTER — Ambulatory Visit: Payer: Self-pay | Admitting: Licensed Clinical Social Worker

## 2022-09-29 ENCOUNTER — Encounter: Payer: Self-pay | Admitting: Licensed Clinical Social Worker

## 2022-09-29 ENCOUNTER — Telehealth: Payer: Self-pay | Admitting: Licensed Clinical Social Worker

## 2022-09-29 DIAGNOSIS — C50911 Malignant neoplasm of unspecified site of right female breast: Secondary | ICD-10-CM | POA: Diagnosis not present

## 2022-09-29 DIAGNOSIS — R112 Nausea with vomiting, unspecified: Secondary | ICD-10-CM | POA: Diagnosis not present

## 2022-09-29 DIAGNOSIS — Z170 Estrogen receptor positive status [ER+]: Secondary | ICD-10-CM | POA: Diagnosis not present

## 2022-09-29 DIAGNOSIS — F331 Major depressive disorder, recurrent, moderate: Secondary | ICD-10-CM | POA: Diagnosis not present

## 2022-09-29 DIAGNOSIS — Z1379 Encounter for other screening for genetic and chromosomal anomalies: Secondary | ICD-10-CM

## 2022-09-29 DIAGNOSIS — I10 Essential (primary) hypertension: Secondary | ICD-10-CM | POA: Diagnosis not present

## 2022-09-29 DIAGNOSIS — M79672 Pain in left foot: Secondary | ICD-10-CM | POA: Diagnosis not present

## 2022-09-29 DIAGNOSIS — E7800 Pure hypercholesterolemia, unspecified: Secondary | ICD-10-CM | POA: Diagnosis not present

## 2022-09-29 DIAGNOSIS — K219 Gastro-esophageal reflux disease without esophagitis: Secondary | ICD-10-CM | POA: Diagnosis not present

## 2022-09-29 NOTE — Telephone Encounter (Signed)
I contacted Angela Deleon to discuss her genetic testing results. No pathogenic variants were identified in the 48 genes analyzed. Detailed clinic note to follow.   The test report has been scanned into EPIC and is located under the Molecular Pathology section of the Results Review tab.  A portion of the result report is included below for reference.      Faith Rogue, MS, [facility] Genetic Counselor [email or web address] Phone: [phone]

## 2022-09-29 NOTE — Progress Notes (Signed)
Called to speak with patient regarding grants that may be available to her, left her a voicemail to call me back.

## 2022-09-29 NOTE — Progress Notes (Signed)
HPI:   Angela Deleon was previously seen in the [facility] clinic due to a personal and family history of cancer and concerns regarding a hereditary predisposition to cancer. Please refer to our prior cancer genetics clinic note for more information regarding our discussion, assessment and recommendations, at the time. Angela Deleon recent genetic test results were disclosed to her, as were recommendations warranted by these results. These results and recommendations are discussed in more detail below.  CANCER HISTORY:  Oncology History  Breast cancer ([place])  07/15/2022 Initial Diagnosis   Patient noticed a palpable lump in the right upper outer quadrant of the breast.  Last mammogram was in 2007.   07/27/2022 Mammogram   Diagnostic mammogram and US  IMPRESSION: 1. Highly suspicious mass in the RIGHT breast at the 11 o'clock axis, 3 cm from the nipple, measuring 1.9 cm, corresponding to the patient's palpable area of concern. Ultrasound-guided biopsy is recommended. 2. Several additional hypoechoic areas adjacent to the dominant RIGHT breast mass, suspected satellite masses, most peripheral component located at the 10 o'clock axis, 5 cm from the nipple, measuring 1 cm. Ultrasound-guided biopsy is recommended this most peripheral hypoechoic area/satellite mass. 3. No evidence of malignancy within the LEFT breast.   08/13/2022 Pathology Results   DIAGNOSIS: A.  BREAST, RIGHT 10:00 5 CMFN (HEART); ULTRASOUND-GUIDED BIOPSY: - INVASIVE MAMMARY CARCINOMA, NO SPECIAL TYPE. Size of invasive carcinoma: 6 mm in this sample Histologic grade of invasive carcinoma: Grade 1                      Glandular/tubular differentiation score: 2                      Nuclear pleomorphism score: 2                      Mitotic rate score: 1                      Total score: 5 Ductal carcinoma in situ: Not identified Lymphovascular invasion: Not identified ER/PR/HER2: Immunohistochemistry will be  performed on block A1, with reflex to [place] for HER2 2+. The results will be reported in an addendum.  B.  BREAST, RIGHT 11:00 3 CMFN (VENUS); ULTRASOUND-GUIDED BIOPSY: - INVASIVE MAMMARY CARCINOMA, NO SPECIAL TYPE, INVOLVING ORGANIZING FAT NECROSIS. Size of invasive carcinoma: 12 mm in this sample Histologic grade of invasive carcinoma: Grade 2                      Glandular/tubular differentiation score: 3                      Nuclear pleomorphism score: 2                      Mitotic rate score: 1                      Total score: 6 Ductal carcinoma in situ: Not identified Lymphovascular invasion: Not identified   ADDENDUM:  CASE SUMMARY: BREAST BIOMARKER TESTS - PART A: RIGHT 10:00 5 CMFN  (HEART)  Estrogen Receptor (ER) Status: POSITIVE          Percentage of cells with nuclear positivity: Greater than 90%          Average intensity of staining: Strong   Progesterone Receptor (PgR) Status: POSITIVE  Percentage of cells with nuclear positivity: 51-90%          Average intensity of staining: Moderate   HER2 (by immunohistochemistry): NEGATIVE (Score 0)   Ki-67: Not performed   CASE SUMMARY: BREAST BIOMARKER TESTS - PART B: RIGHT 11:00 3 CMFN (VENUS) Estrogen Receptor (ER) Status: POSITIVE         Percentage of cells with nuclear positivity: Greater than 90%         Average intensity of staining: Strong  Progesterone Receptor (PgR) Status: POSITIVE         Percentage of cells with nuclear positivity: 11-50%         Average intensity of staining: Moderate  HER2 (by immunohistochemistry): NEGATIVE (Score 0)    08/27/2022 Definitive Surgery   Right mastectomy with SLNB by Dr. Peyton Najjar  Pathology showed 2.8 cm invasive mammary cancer upper outer quadrant, grade 2, 2 smaller foci in the lower outer quadrant measuring 5 mm and 2.5 mm grade 2, margins negative, 1/4 lymph node positive for macrometastatic disease 3.2 mm, ENE negative, no LVI, pathologic stage pT2 N1a      08/27/2022 Oncotype testing   Oncotype DX recurrence score 5.  No benefit from chemotherapy.    Genetic Testing   Negative genetic testing. No pathogenic variants identified on the [facility] Common Hereditary Cancers+RNA panel. The report date is 09/27/2022.  The Common Hereditary Cancers Panel + RNA offered by [facility] includes sequencing and/or deletion duplication testing of the following 48 genes: APC*, ATM*, AXIN2, BAP1, BARD1, BMPR1A, BRCA1, BRCA2, BRIP1, CDH1, CDK4, CDKN2A (p14ARF), CDKN2A (p16INK4a), CHEK2, CTNNA1, DICER1*, EPCAM*, FH*, GREM1*, HOXB13, KIT, MBD4, MEN1*, MLH1*, MSH2*, MSH3*, MSH6*, MUTYH, NF1*, NTHL1, PALB2, PDGFRA, PMS2*, POLD1*, POLE, PTEN*, RAD51C, RAD51D, SDHA*, SDHB, SDHC*, SDHD, SMAD4, SMARCA4, STK11, TP53, TSC1*, TSC2, VHL.      FAMILY HISTORY:  We obtained a detailed, 4-generation family history.  Significant diagnoses are listed below: Family History  Problem Relation Age of Onset   Colon cancer Mother 24   Ovarian cancer Mother 12   Cancer Brother        unk type   Ovarian cancer Maternal Grandmother    Esophageal cancer Half-Brother    Angela Deleon has 3 daughters, 52, 84 and 40. She has 1 full brother and 2 maternal half brothers. Her full brother has cancer in his bones/back, unknown type. Her maternal half brother passed of esophageal cancer at 60.    Angela Deleon mother had colon and ovarian cancer recently diagnosed in her 23s, she is living at 54. Patient has 2 maternal aunts and 2 maternal uncles, none had cancer that she is aware of. Maternal grandmother possibly had ovarian cancer and passed over age 38.   Angela Deleon has information about her paternal relatives.   Angela Deleon is unaware of previous family history of genetic testing for hereditary cancer risks. There is no reported Ashkenazi Jewish ancestry. There is no known consanguinity.      GENETIC TEST RESULTS:  The [facility] Common Hereditary Cancers+RNA Panel found no pathogenic mutations.    The Common Hereditary Cancers Panel + RNA offered by [facility] includes sequencing and/or deletion duplication testing of the following 48 genes: APC*, ATM*, AXIN2, BAP1, BARD1, BMPR1A, BRCA1, BRCA2, BRIP1, CDH1, CDK4, CDKN2A (p14ARF), CDKN2A (p16INK4a), CHEK2, CTNNA1, DICER1*, EPCAM*, FH*, GREM1*, HOXB13, KIT, MBD4, MEN1*, MLH1*, MSH2*, MSH3*, MSH6*, MUTYH, NF1*, NTHL1, PALB2, PDGFRA, PMS2*, POLD1*, POLE, PTEN*, RAD51C, RAD51D, SDHA*, SDHB, SDHC*, SDHD, SMAD4, SMARCA4, STK11, TP53, TSC1*, TSC2, VHL.Marland Kitchen   The  test report has been scanned into EPIC and is located under the Molecular Pathology section of the Results Review tab.  A portion of the result report is included below for reference. Genetic testing reported out on 09/27/2022.     Even though a pathogenic variant was not identified, possible explanations for the cancer in the family may include: There may be no hereditary risk for cancer in the family. The cancers in Ms. Mariotti and/or her family may be sporadic/familial or due to other genetic and environmental factors. There may be a gene mutation in one of these genes that current testing methods cannot detect but that chance is small. There could be another gene that has not yet been discovered, or that we have not yet tested, that is responsible for the cancer diagnoses in the family.  It is also possible there is a hereditary cause for the cancer in the family that Ms. Polanco did not inherit.  Therefore, it is important to remain in touch with cancer genetics in the future so that we can continue to offer Ms. Lizana the most up to date genetic testing.   ADDITIONAL GENETIC TESTING:  We discussed with Ms. Steptoe that her genetic testing was fairly extensive.  If there are additional relevant genes identified to increase cancer risk that can be analyzed in the future, we would be happy to discuss and coordinate this testing at that time.    CANCER SCREENING RECOMMENDATIONS:  Ms. Huntsberry test  result is considered negative (normal).  This means that we have not identified a hereditary cause for her personal and family history of cancer at this time.   An individual's cancer risk and medical management are not determined by genetic test results alone. Overall cancer risk assessment incorporates additional factors, including personal medical history, family history, and any available genetic information that may result in a personalized plan for cancer prevention and surveillance. Therefore, it is recommended she continue to follow the cancer management and screening guidelines provided by her oncology and primary healthcare provider.  RECOMMENDATIONS FOR FAMILY MEMBERS:   Since she did not inherit a identifiable mutation in a cancer predisposition gene included on this panel, her children could not have inherited a known mutation from her in one of these genes. Individuals in this family might be at some increased risk of developing cancer, over the general population risk, due to the family history of cancer.  Individuals in the family should notify their providers of the family history of cancer. We recommend women in this family have a yearly mammogram beginning at age 44, or 9 years younger than the earliest onset of cancer, an annual clinical breast exam, and perform monthly breast self-exams.  Family members should have colonoscopies by at age 21, or earlier, as recommended by their providers. Other members of the family may still carry a pathogenic variant in one of these genes that Ms. Gwin did not inherit. Based on the family history, we recommend her mother/maternal relatives have genetic counseling and testing. Ms. Hebel will let us know if we can be of any assistance in coordinating genetic counseling and/or testing for this family member.     FOLLOW-UP:  Lastly, we discussed with Ms. Hoeschen that cancer genetics is a rapidly advancing field and it is possible that new genetic tests  will be appropriate for her and/or her family members in the future. We encouraged her to remain in contact with cancer genetics on an annual basis so we can update  her personal and family histories and let her know of advances in cancer genetics that may benefit this family.   Our contact number was provided. Ms. Childers questions were answered to her satisfaction, and she knows she is welcome to call us at anytime with additional questions or concerns.    Faith Rogue, MS, [facility] Genetic Counselor [email or web address] Phone: [phone]

## 2022-10-06 DIAGNOSIS — Z1389 Encounter for screening for other disorder: Secondary | ICD-10-CM | POA: Diagnosis not present

## 2022-10-06 DIAGNOSIS — R69 Illness, unspecified: Secondary | ICD-10-CM | POA: Diagnosis not present

## 2022-10-06 DIAGNOSIS — F411 Generalized anxiety disorder: Secondary | ICD-10-CM | POA: Diagnosis not present

## 2022-10-06 DIAGNOSIS — F4001 Agoraphobia with panic disorder: Secondary | ICD-10-CM | POA: Diagnosis not present

## 2022-10-06 DIAGNOSIS — Z79899 Other long term (current) drug therapy: Secondary | ICD-10-CM | POA: Diagnosis not present

## 2022-10-08 ENCOUNTER — Other Ambulatory Visit: Payer: Self-pay | Admitting: Internal Medicine

## 2022-10-08 ENCOUNTER — Inpatient Hospital Stay: Payer: Medicaid Other | Admitting: Internal Medicine

## 2022-10-08 ENCOUNTER — Encounter: Payer: Self-pay | Admitting: Internal Medicine

## 2022-10-08 VITALS — BP 156/78 | HR 71 | Temp 98.2°F | Resp 20 | Wt 190.1 lb

## 2022-10-08 DIAGNOSIS — Z79811 Long term (current) use of aromatase inhibitors: Secondary | ICD-10-CM | POA: Diagnosis not present

## 2022-10-08 DIAGNOSIS — F418 Other specified anxiety disorders: Secondary | ICD-10-CM | POA: Diagnosis not present

## 2022-10-08 DIAGNOSIS — C50911 Malignant neoplasm of unspecified site of right female breast: Secondary | ICD-10-CM

## 2022-10-08 DIAGNOSIS — F17210 Nicotine dependence, cigarettes, uncomplicated: Secondary | ICD-10-CM | POA: Diagnosis not present

## 2022-10-08 DIAGNOSIS — I10 Essential (primary) hypertension: Secondary | ICD-10-CM | POA: Diagnosis not present

## 2022-10-08 DIAGNOSIS — Z800 Family history of malignant neoplasm of digestive organs: Secondary | ICD-10-CM | POA: Diagnosis not present

## 2022-10-08 DIAGNOSIS — M797 Fibromyalgia: Secondary | ICD-10-CM | POA: Diagnosis not present

## 2022-10-08 DIAGNOSIS — M79621 Pain in right upper arm: Secondary | ICD-10-CM

## 2022-10-08 DIAGNOSIS — G8918 Other acute postprocedural pain: Secondary | ICD-10-CM | POA: Diagnosis not present

## 2022-10-08 DIAGNOSIS — N644 Mastodynia: Secondary | ICD-10-CM

## 2022-10-08 DIAGNOSIS — Z170 Estrogen receptor positive status [ER+]: Secondary | ICD-10-CM | POA: Diagnosis not present

## 2022-10-08 DIAGNOSIS — C50411 Malignant neoplasm of upper-outer quadrant of right female breast: Secondary | ICD-10-CM | POA: Diagnosis not present

## 2022-10-08 DIAGNOSIS — Z9011 Acquired absence of right breast and nipple: Secondary | ICD-10-CM | POA: Diagnosis not present

## 2022-10-08 DIAGNOSIS — Z79899 Other long term (current) drug therapy: Secondary | ICD-10-CM | POA: Diagnosis not present

## 2022-10-08 DIAGNOSIS — Z1379 Encounter for other screening for genetic and chromosomal anomalies: Secondary | ICD-10-CM | POA: Diagnosis not present

## 2022-10-08 DIAGNOSIS — Z8041 Family history of malignant neoplasm of ovary: Secondary | ICD-10-CM | POA: Diagnosis not present

## 2022-10-08 NOTE — Progress Notes (Signed)
Angela Deleon CONSULT NOTE  Patient Care Team: Pcp, No as PCP - General Daiva Huge, RN as Oncology Nurse Navigator  REFERRING PROVIDER: Dr. Lynnda Shields  REASON FOR REFFERAL: newly diagnosed right breast cancer  CANCER STAGING   Cancer Staging  Breast cancer [facility]) Staging form: Breast, AJCC 8th Edition - Pathologic stage from 08/27/2022: Stage IB (pT2, pN1a(sn), cM0, G2, ER+, PR+, HER2-, Oncotype DX score: 5) - Signed by Jane Canary, MD on 09/17/2022 Stage prefix: Initial diagnosis Method of lymph node assessment: Sentinel lymph node biopsy Multigene prognostic tests performed: Oncotype DX Recurrence score range: Less than 11 Histologic grading system: 3 grade system   ASSESSMENT & PLAN:  Angela Deleon 62 y.o. female with pmh of fibromyalgia, hypertension and depression was seen in medical oncology to discuss treatment for right-sided breast cancer.  # Right breast invasive ductal cancer, pathologic stage Ib (pT2N1) ER/PR+, HER2- -self palpable. S/p US guided biopsy on 08/13/2022.  -s/p right-sided mastectomy with SLNB by Dr. Peyton Najjar on 08/27/2022.  Pathology showed 2.8 cm invasive mammary cancer upper outer quadrant, grade 2, 2 smaller foci in the lower outer quadrant measuring 5 mm and 2.5 mm grade 2, margins negative, 1/4 lymph node positive for macrometastatic disease 3.2 mm, ENE negative, no LVI, pathologic stage pT2 N1a   -Oncotype DX testing showed low recurrence score of 5.  So there is no benefit of chemotherapy.  Was seen by Dr. Baruch Gouty and no role for radiation.  -Patient continues to have pain at the mastectomy site and in the right axilla.  2 weeks ago, she also noticed a palpable painful area in her left breast in the lower outer quadrant.  She is taking ibuprofen and extra strength Tylenol.  Patient reports she will be seeing Dr. Peyton Najjar today and was advised to discuss with him further.  I will continue to hold off on starting letrozole to allow patient  time for postop recovery.  -DEXA scan to obtain baseline bone health.  # Postoperative pain -Patient completed oxycodone.  She is using ibuprofen total 600 mg in 24 hours as instructed which is not helping with pain.  Advised to add extra strength Tylenol.  If the pain is still not under control, patient advised to call Dr. Deniece Ree office to assess the need for further oxycodone.  # Family history of colon, uterine and ovarian cancer -[facility] gene panel negative.  # Anxiety -Patient follows with [place] [facility].  She is on multiple medications-Klonopin, Lexapro, Wellbutrin, Seroquel.   No orders of the defined types were placed in this encounter.  RTC in 3 weeks for MD visit to discuss about starting letrozole  The total time spent in the appointment was 30 minutes encounter with patients including review of chart and various tests results, discussions about plan of care and coordination of care plan   All questions were answered. The patient knows to call the clinic with any problems, questions or concerns. No barriers to learning was detected.  Jane Canary, MD 1/25/202412:58 PM   HISTORY OF PRESENTING ILLNESS:  Angela Deleon 62 y.o. female with pmh of fibromyalgia, hypertension and depression was seen in medical oncology to discuss treatment for right-sided breast cancer.  Patient was accompanied today by his fiance.  Patient continues to have pain at the mastectomy site and in the right axilla.  She might have noticed this mild improvement but not much.  She continues to use ibuprofen and Tylenol for pain.  Couple of weeks ago, felt a palpable painful  area in the left breast.  Does not bother her unless she touches it.  I have reviewed her chart and materials related to her cancer extensively and collaborated history with the patient. Summary of oncologic history is as follows: Oncology History  Breast cancer ([place])  07/15/2022 Initial Diagnosis   Patient noticed a  palpable lump in the right upper outer quadrant of the breast.  Last mammogram was in 2007.   07/27/2022 Mammogram   Diagnostic mammogram and US  IMPRESSION: 1. Highly suspicious mass in the RIGHT breast at the 11 o'clock axis, 3 cm from the nipple, measuring 1.9 cm, corresponding to the patient's palpable area of concern. Ultrasound-guided biopsy is recommended. 2. Several additional hypoechoic areas adjacent to the dominant RIGHT breast mass, suspected satellite masses, most peripheral component located at the 10 o'clock axis, 5 cm from the nipple, measuring 1 cm. Ultrasound-guided biopsy is recommended this most peripheral hypoechoic area/satellite mass. 3. No evidence of malignancy within the LEFT breast.   08/13/2022 Pathology Results   DIAGNOSIS: A.  BREAST, RIGHT 10:00 5 CMFN (HEART); ULTRASOUND-GUIDED BIOPSY: - INVASIVE MAMMARY CARCINOMA, NO SPECIAL TYPE. Size of invasive carcinoma: 6 mm in this sample Histologic grade of invasive carcinoma: Grade 1                      Glandular/tubular differentiation score: 2                      Nuclear pleomorphism score: 2                      Mitotic rate score: 1                      Total score: 5 Ductal carcinoma in situ: Not identified Lymphovascular invasion: Not identified ER/PR/HER2: Immunohistochemistry will be performed on block A1, with reflex to [place] for HER2 2+. The results will be reported in an addendum.  B.  BREAST, RIGHT 11:00 3 CMFN (VENUS); ULTRASOUND-GUIDED BIOPSY: - INVASIVE MAMMARY CARCINOMA, NO SPECIAL TYPE, INVOLVING ORGANIZING FAT NECROSIS. Size of invasive carcinoma: 12 mm in this sample Histologic grade of invasive carcinoma: Grade 2                      Glandular/tubular differentiation score: 3                      Nuclear pleomorphism score: 2                      Mitotic rate score: 1                      Total score: 6 Ductal carcinoma in situ: Not identified Lymphovascular invasion: Not  identified   ADDENDUM:  CASE SUMMARY: BREAST BIOMARKER TESTS - PART A: RIGHT 10:00 5 CMFN  (HEART)  Estrogen Receptor (ER) Status: POSITIVE          Percentage of cells with nuclear positivity: Greater than 90%          Average intensity of staining: Strong   Progesterone Receptor (PgR) Status: POSITIVE          Percentage of cells with nuclear positivity: 51-90%          Average intensity of staining: Moderate   HER2 (by immunohistochemistry): NEGATIVE (Score 0)   Ki-67: Not performed   CASE SUMMARY:  BREAST BIOMARKER TESTS - PART B: RIGHT 11:00 3 CMFN (VENUS) Estrogen Receptor (ER) Status: POSITIVE         Percentage of cells with nuclear positivity: Greater than 90%         Average intensity of staining: Strong  Progesterone Receptor (PgR) Status: POSITIVE         Percentage of cells with nuclear positivity: 11-50%         Average intensity of staining: Moderate  HER2 (by immunohistochemistry): NEGATIVE (Score 0)    08/27/2022 Definitive Surgery   Right mastectomy with SLNB by Dr. Peyton Najjar  Pathology showed 2.8 cm invasive mammary cancer upper outer quadrant, grade 2, 2 smaller foci in the lower outer quadrant measuring 5 mm and 2.5 mm grade 2, margins negative, 1/4 lymph node positive for macrometastatic disease 3.2 mm, ENE negative, no LVI, pathologic stage pT2 N1a     08/27/2022 Oncotype testing   Oncotype DX recurrence score 5.  No benefit from chemotherapy.    Genetic Testing   Negative genetic testing. No pathogenic variants identified on the [facility] Common Hereditary Cancers+RNA panel. The report date is 09/27/2022.  The Common Hereditary Cancers Panel + RNA offered by [facility] includes sequencing and/or deletion duplication testing of the following 48 genes: APC*, ATM*, AXIN2, BAP1, BARD1, BMPR1A, BRCA1, BRCA2, BRIP1, CDH1, CDK4, CDKN2A (p14ARF), CDKN2A (p16INK4a), CHEK2, CTNNA1, DICER1*, EPCAM*, FH*, GREM1*, HOXB13, KIT, MBD4, MEN1*, MLH1*, MSH2*, MSH3*, MSH6*, MUTYH,  NF1*, NTHL1, PALB2, PDGFRA, PMS2*, POLD1*, POLE, PTEN*, RAD51C, RAD51D, SDHA*, SDHB, SDHC*, SDHD, SMAD4, SMARCA4, STK11, TP53, TSC1*, TSC2, VHL.     Menarche age 23 Age at first birth 78 Birth control yes in 24s Menopause age 53 HRT no  MEDICAL HISTORY:  Past Medical History:  Diagnosis Date   Acid reflux    Anxiety    Asthma    Breast cancer ([place]) 08/2022   right   Depression    Fibromyalgia    Hypertension, essential     SURGICAL HISTORY: Past Surgical History:  Procedure Laterality Date   BREAST BIOPSY Right    2006? neg   BREAST BIOPSY Right 08/13/2022   rt br u/s bx 11:00  venus clip path pending   BREAST BIOPSY Right 08/13/2022   [place] RT BREAST BX W LOC DEV 1ST LESION IMG BX SPEC US GUIDE 08/13/2022 [facility]   BREAST BIOPSY Right 08/13/2022   [place] RT BREAST BX W LOC DEV EA ADD LESION IMG BX SPEC US GUIDE 08/13/2022 [facility]   COLONOSCOPY     CYST EXCISION Right    arm   DIRECT LARYNGOSCOPY  11/14/2013   MASTECTOMY W/ SENTINEL NODE BIOPSY Right 08/27/2022   Procedure: MASTECTOMY WITH SENTINEL LYMPH NODE BIOPSY;  Surgeon: Herbert Pun, MD;  Location: [facility];  Service: General;  Laterality: Right;   TUBAL LIGATION  1992    SOCIAL HISTORY: Social History   Socioeconomic History   Marital status: Single    Spouse name: Not on file   Number of children: Not on file   Years of education: Not on file   Highest education level: Not on file  Occupational History   Not on file  Tobacco Use   Smoking status: Every Day    Packs/day: 0.50    Types: Cigarettes   Smokeless tobacco: Never  Vaping Use   Vaping Use: Never used  Substance and Sexual Activity   Alcohol use: Not Currently    Comment: social drinker   Drug use: Yes    Types: Marijuana  Sexual activity: Yes    Birth control/protection: Post-menopausal  Other Topics Concern   Not on file  Social History Narrative   Lives with fiance in a camper   Social Determinants of  Health   Financial Resource Strain: High Risk (08/19/2022)   Overall Financial Resource Strain (CARDIA)    Difficulty of Paying Living Expenses: Hard  Food Insecurity: No Food Insecurity (08/28/2022)   Hunger Vital Sign    Worried About Running Out of Food in the Last Year: Never true    [place] in the Last Year: Never true  Recent Concern: [place] Present (08/19/2022)   Hunger Vital Sign    Worried About Running Out of Food in the Last Year: Sometimes true    Ran Out of Food in the Last Year: Sometimes true  Transportation Needs: Unmet Transportation Needs (08/28/2022)   PRAPARE - Hydrologist (Medical): Yes    Lack of Transportation (Non-Medical): Yes  Physical Activity: Inactive (08/19/2022)   Exercise Vital Sign    Days of Exercise per Week: 0 days    Minutes of Exercise per Session: 0 min  Stress: Stress Concern Present (08/19/2022)   [place]    Feeling of Stress : To some extent  Social Connections: Moderately Isolated (08/19/2022)   Social Connection and Isolation Panel [NHANES]    Frequency of Communication with Friends and Family: Three times a week    Frequency of Social Gatherings with Friends and Family: Once a week    Attends Religious Services: Never    Marine scientist or Organizations: No    Attends Archivist Meetings: Never    Marital Status: Living with partner  Intimate Partner Violence: Not At Risk (08/28/2022)   Humiliation, Afraid, Rape, and Kick questionnaire    Fear of Current or Ex-Partner: No    Emotionally Abused: No    Physically Abused: No    Sexually Abused: No    FAMILY HISTORY: Family History  Problem Relation Age of Onset   Colon cancer Mother 52   Ovarian cancer Mother 87   Cancer Brother        unk type   Ovarian cancer Maternal Grandmother    Esophageal cancer Half-Brother     ALLERGIES:   is allergic to iodinated contrast media and penicillins.  MEDICATIONS:  Current Outpatient Medications  Medication Sig Dispense Refill   albuterol (VENTOLIN HFA) 108 (90 Base) MCG/ACT inhaler Inhale 2 puffs into the lungs every 6 (six) hours as needed.     atorvastatin (LIPITOR) 20 MG tablet Take 20 mg by mouth daily.     buPROPion (WELLBUTRIN XL) 150 MG 24 hr tablet Take 150 mg by mouth every morning.     clonazePAM (KLONOPIN) 1 MG tablet Take 1 mg by mouth 2 (two) times daily as needed for anxiety.     doxycycline (VIBRA-TABS) 100 MG tablet Take 100 mg by mouth 2 (two) times daily.     famotidine (PEPCID) 20 MG tablet Take 20 mg by mouth daily.     gabapentin (NEURONTIN) 400 MG capsule Take 400 mg by mouth 3 (three) times daily.     ibuprofen (ADVIL) 200 MG tablet Take 600 mg by mouth daily as needed.     lisinopril-hydrochlorothiazide (ZESTORETIC) 20-12.5 MG tablet Take 1 tablet by mouth daily. 30 tablet 1   methocarbamol (ROBAXIN) 500 MG tablet Take 1 tablet (500 mg  total) by mouth 4 (four) times daily. 15 tablet 0   oxyCODONE-acetaminophen (PERCOCET) 5-325 MG tablet Take 1 tablet by mouth every 4 (four) hours as needed for severe pain. 20 tablet 0   QUEtiapine (SEROQUEL) 50 MG tablet Take 50 mg by mouth at bedtime.     sertraline (ZOLOFT) 100 MG tablet Take 150 mg by mouth daily.     SUMAtriptan (IMITREX) 50 MG tablet Take 1 tablet (50 mg total) by mouth once for 1 dose. May repeat in 2 hours if headache persists or recurs. 10 tablet 0   escitalopram (LEXAPRO) 20 MG tablet Take 20 mg by mouth daily. (Patient not taking: Reported on 08/27/2022)     No current facility-administered medications for this visit.    REVIEW OF SYSTEMS:   Pertinent information mentioned in HPI All other systems were reviewed with the patient and are negative.  PHYSICAL EXAMINATION: ECOG PERFORMANCE STATUS: 0 - Asymptomatic  Vitals:   10/08/22 1004  BP: (!) 156/78  Pulse: 71  Resp: 20  Temp: 98.2 F  (36.8 C)  SpO2: 100%    Filed Weights   10/08/22 1004  Weight: 190 lb 1.6 oz (86.2 kg)     GENERAL:alert, no distress and comfortable SKIN: skin color, texture, turgor are normal, no rashes or significant lesions EYES: normal, conjunctiva are pink and non-injected, sclera clear OROPHARYNX:no exudate, no erythema and lips, buccal mucosa, and tongue normal  NECK: supple, thyroid normal size, non-tender, without nodularity LYMPH:  no palpable lymphadenopathy in the cervical, axillary or inguinal LUNGS: clear to auscultation and percussion with normal breathing effort HEART: regular rate & rhythm and no murmurs and no lower extremity edema ABDOMEN:abdomen soft, non-tender and normal bowel sounds Musculoskeletal:no cyanosis of digits and no clubbing  PSYCH: alert & oriented x 3 with fluent speech NEURO: no focal motor/sensory deficits  Right breat-suture site healing well.  On the medial aspect slightly open but no signs of infection noted.  Bruising in the right axilla. Left breast, small movable pea-sized palpable tender area in the left breast in the lower outer quadrant.   LABORATORY DATA:  I have reviewed the data as listed Lab Results  Component Value Date   WBC 15.6 (H) 08/28/2022   HGB 12.3 08/28/2022   HCT 39.3 08/28/2022   MCV 88.1 08/28/2022   PLT 208 08/28/2022   Recent Labs    01/21/22 1301 08/26/22 1111 08/28/22 0644  NA 135 138 137  K 3.9 4.1 4.2  CL 104 106 106  CO2 '24 28 24  '$ GLUCOSE 98 92 118*  BUN '14 18 19  '$ CREATININE 0.85 0.74 0.78  CALCIUM 8.8* 8.8* 8.6*  GFRNONAA >60 >60 >60    RADIOGRAPHIC STUDIES: I have personally reviewed the radiological images as listed and agreed with the findings in the report. No results found.

## 2022-10-08 NOTE — Progress Notes (Signed)
Patient states she is having pain under right arm pit where she had surgrey.

## 2022-10-09 ENCOUNTER — Other Ambulatory Visit: Payer: Self-pay | Admitting: General Surgery

## 2022-10-09 DIAGNOSIS — N644 Mastodynia: Secondary | ICD-10-CM

## 2022-10-15 ENCOUNTER — Other Ambulatory Visit: Payer: Disability Insurance

## 2022-10-15 DIAGNOSIS — Z419 Encounter for procedure for purposes other than remedying health state, unspecified: Secondary | ICD-10-CM | POA: Diagnosis not present

## 2022-10-19 ENCOUNTER — Encounter: Payer: Self-pay | Admitting: Occupational Therapy

## 2022-10-19 ENCOUNTER — Ambulatory Visit: Payer: Medicaid Other | Attending: General Surgery | Admitting: Occupational Therapy

## 2022-10-19 DIAGNOSIS — M25611 Stiffness of right shoulder, not elsewhere classified: Secondary | ICD-10-CM | POA: Insufficient documentation

## 2022-10-19 DIAGNOSIS — M79601 Pain in right arm: Secondary | ICD-10-CM | POA: Diagnosis not present

## 2022-10-19 DIAGNOSIS — L905 Scar conditions and fibrosis of skin: Secondary | ICD-10-CM | POA: Diagnosis not present

## 2022-10-19 DIAGNOSIS — R0789 Other chest pain: Secondary | ICD-10-CM | POA: Insufficient documentation

## 2022-10-19 NOTE — Therapy (Signed)
[facility] [address], [place], [address] Phone: [phone]   Fax:  [phone]  Occupational Therapy Evaluation  Patient Details  Name: Angela Deleon MRN: [id number] Date of Birth: 12-05-1960 Referring Provider (OT): Dr Windell Moment   Encounter Date: 10/19/2022   OT End of Session - 10/19/22 1623     Visit Number 1    Number of Visits 6    Date for OT Re-Evaluation 11/30/22    OT Start Time 0955    OT Stop Time 1035    OT Time Calculation (min) 40 min    Activity Tolerance Patient tolerated treatment well    Behavior During Therapy [facility] for tasks assessed/performed             Past Medical History:  Diagnosis Date   Acid reflux    Anxiety    Asthma    Breast cancer ([place]) 08/2022   right   Depression    Fibromyalgia    Hypertension, essential     Past Surgical History:  Procedure Laterality Date   BREAST BIOPSY Right    2006? neg   BREAST BIOPSY Right 08/13/2022   rt br u/s bx 11:00  venus clip path pending   BREAST BIOPSY Right 08/13/2022   [place] RT BREAST BX W LOC DEV 1ST LESION IMG BX SPEC US GUIDE 08/13/2022 [facility]   BREAST BIOPSY Right 08/13/2022   [place] RT BREAST BX W LOC DEV EA ADD LESION IMG BX SPEC US GUIDE 08/13/2022 [facility]   COLONOSCOPY     CYST EXCISION Right    arm   DIRECT LARYNGOSCOPY  11/14/2013   MASTECTOMY W/ SENTINEL NODE BIOPSY Right 08/27/2022   Procedure: MASTECTOMY WITH SENTINEL LYMPH NODE BIOPSY;  Surgeon: Herbert Pun, MD;  Location: [facility];  Service: General;  Laterality: Right;   TUBAL LIGATION  1992    There were no vitals filed for this visit.   Subjective Assessment - 10/19/22 1604     Subjective  My whole R side hurts.  My chest, under my arm, the shoulder.  If I reach, I feel like this electric shocks and pins-and-needles going down into my arm and hand. My scar is tender and sensitive - cannot tolerate bra on any textures on my scar  and the end of scar under my arm still small area open    Pertinent History on 09/11/23 DR Cintron-Diaz CHEST: Right chest without fluid collection. Right anterior shoulder seroma. The was aspirated with clear serous fluid. No sign of infection. Drain removed.   IMPRESSION:  Malignant neoplasm involving both nipple and areola of right breast in female, estrogen receptor positive (C50.011, Z17.0) -S/p R mastectomy with sentinel node biopsy -Significantly decreased output from drain. Drain removed today. -Large fluid collection in anterior area of the shoulder. It was aspirated completely. No sign of infection. On 10/08/22 refer to OT for muscle pain s/p mastectomy    Patient Stated Goals I want the pain better in my arm and my chest that I can use my hand to bath and dress and do things around the house.    Currently in Pain? Yes    Pain Score 9     Pain Location Chest   Arm   Pain Orientation Right    Pain Descriptors / Indicators Aching;Radiating;Tingling;Tightness;Shooting;Tender    Pain Type Surgical pain    Pain Onset More than a month ago  [facility] OT Assessment - 10/19/22 0001       Assessment   Medical Diagnosis S/p R mastectomy with muscle pain    Referring Provider (OT) Dr Windell Moment    Onset Date/Surgical Date 08/27/22    Hand Dominance Right      Home  Environment   Lives With --   Fiance     Prior Function   Vocation On disability    Leisure Dog, house work, play games on phone      AROM   Right Shoulder Flexion 110 Degrees    Right Shoulder ABduction 125 Degrees    Left Shoulder Flexion 110 Degrees    Left Shoulder ABduction 130 Degrees              LYMPHEDEMA/ONCOLOGY QUESTIONNAIRE - 10/19/22 0001       Right Upper Extremity Lymphedema   15 cm Proximal to Olecranon Process 36 cm    10 cm Proximal to Olecranon Process 32 cm    Olecranon Process 24.5 cm      Left Upper Extremity Lymphedema   15 cm Proximal to Olecranon Process 35.3 cm     10 cm Proximal to Olecranon Process 31.5 cm    Olecranon Process 24 cm              Patient and fianc was educated in desensitization and scar massage to right chest and anterior axilla.  Patient to do self massage as well as soft textures and rougher textures several times during the day for short.  Of time. Patient was educated in anatomy postmastectomy causing limitation for shoulder flexion and abduction with increased pain. Patient to do 3 times a day in supine bilateral shoulder flexion as well as active assisted range of motion for flexion and abduction and external rotation. 15 reps each. Patient show increase range of motion.  Patient to follow-up in a week.              OT Education - 10/19/22 1622     Education Details Findings of evaluation and home program.    Person(s) Educated Patient;Other (comment)   fianc   Methods Explanation;Demonstration;Tactile cues;Verbal cues;Handout    Comprehension Verbal cues required;Returned demonstration;Verbalized understanding              OT Short Term Goals - 10/19/22 1632       OT SHORT TERM GOAL #1   Title Patient to be independent in the home program to desensitize and improve scar tissue to right chest for patient to be able to wear bra and dry with a towel roll.    Baseline Cannot tolerate any soft massage, soft textures or drying with a towel.  Cannot wear a bra or even her her compression postmastectomy.    Time 4    Period Weeks    Status New    Target Date 11/16/22               OT Long Term Goals - 10/19/22 1633       OT LONG TERM GOAL #1   Title Right shoulder flexion and abduction improved to within functional limits for patient to pull top overhead and reach into cabinets with less discomfort and pain.    Baseline Pain in right shoulder and arm and chest 9/10 with shooting pins and needle into the arm and hide for sensitivity.  Patient do have fibromyalgia with pain prior to surgery.     Time 6    Period Weeks  Status New    Target Date 11/30/22                   Plan - 10/19/22 1624     Clinical Impression Statement Patient presented at OT evaluation 7-1/2 weeks post right mastectomy with a seroma on anterior shoulder that was draining 2 weeks postop.  Surgery was 08/27/2022.  Patient referred for muscle pain.  Patient presented OT evaluation with hypersensitivity over mastectomy scar as well as anterior shoulder where seroma was drained.  Patient has area of 1 to 2 cm that still open on the lateral end of mastectomy scar.  Patient tender to massage, soft textures and rougher textures on scar and anterior shoulder.  Patient with decreased shoulder flexion and abduction and external rotation with increased pain reported 9/10 with pins-and-needles and shooting pain down in the arm with reaching.  Patient to has a history of fibromyalgia.  Also reports she did not had full motion overhead prior to surgery.  Patient unable to tolerate bra or prosthesis on the right upper thoracic and chest.  Patient can benefit from skilled OT services to decrease scar tissue and hypersensitivity.  As well as increased motion/ strength with less pain to be able to use L UE in ADLs and IADL's,    OT Occupational Profile and History Problem Focused Assessment - Including review of records relating to presenting problem    Occupational performance deficits (Please refer to evaluation for details): ADL's;IADL's;Rest and Sleep;Play;Social Participation;Leisure    Body Structure / Function / Physical Skills ADL;Fascial restriction;IADL;Strength;Pain;Edema;Scar mobility;ROM;UE functional use;Flexibility    Rehab Potential Fair    Clinical Decision Making Limited treatment options, no task modification necessary    Comorbidities Affecting Occupational Performance: May have comorbidities impacting occupational performance   Fibromyalgia; smoker   Modification or Assistance to Complete Evaluation  No  modification of tasks or assist necessary to complete eval    OT Frequency 1x / week    OT Duration 6 weeks    OT Treatment/Interventions Self-care/ADL training;Therapeutic exercise;Manual lymph drainage;Patient/family education;Scar mobilization;Passive range of motion;Manual Therapy    Consulted and Agree with Plan of Care Patient             Patient will benefit from skilled therapeutic intervention in order to improve the following deficits and impairments:   Body Structure / Function / Physical Skills: ADL, Fascial restriction, IADL, Strength, Pain, Edema, Scar mobility, ROM, UE functional use, Flexibility       Visit Diagnosis: Scar condition and fibrosis of skin  Stiffness of right shoulder, not elsewhere classified  Pain, chest wall  Pain in right arm    Problem List Patient Active Problem List   Diagnosis Date Noted   Genetic testing 09/29/2022   Aromatase inhibitor use 09/17/2022   Postoperative pain 09/17/2022   Breast cancer ([place]) 08/17/2022   Financial difficulties 01/21/2022   Family history of colon cancer in mother 04/13/2019   Essential hypertension 11/14/2018   Major depressive disorder 01/27/2018   Lower back pain 03/03/2017   Generalized pain 03/03/2017    Rosalyn Gess, OTR/L,CLT 10/19/2022, 4:36 PM  [facility] [address], [place], [address] Phone: [phone]   Fax:  [phone]  Name: Gelena Klosinski MRN: [id number] Date of Birth: 1961-08-04

## 2022-10-21 DIAGNOSIS — M216X1 Other acquired deformities of right foot: Secondary | ICD-10-CM | POA: Diagnosis not present

## 2022-10-21 DIAGNOSIS — M79672 Pain in left foot: Secondary | ICD-10-CM | POA: Diagnosis not present

## 2022-10-21 DIAGNOSIS — M2042 Other hammer toe(s) (acquired), left foot: Secondary | ICD-10-CM | POA: Diagnosis not present

## 2022-10-21 DIAGNOSIS — Z8739 Personal history of other diseases of the musculoskeletal system and connective tissue: Secondary | ICD-10-CM | POA: Diagnosis not present

## 2022-10-21 DIAGNOSIS — M216X2 Other acquired deformities of left foot: Secondary | ICD-10-CM | POA: Diagnosis not present

## 2022-10-21 DIAGNOSIS — L84 Corns and callosities: Secondary | ICD-10-CM | POA: Diagnosis not present

## 2022-10-21 DIAGNOSIS — Z87891 Personal history of nicotine dependence: Secondary | ICD-10-CM | POA: Diagnosis not present

## 2022-10-21 DIAGNOSIS — M2041 Other hammer toe(s) (acquired), right foot: Secondary | ICD-10-CM | POA: Diagnosis not present

## 2022-10-22 ENCOUNTER — Ambulatory Visit
Admission: RE | Admit: 2022-10-22 | Discharge: 2022-10-22 | Disposition: A | Discharge status: Home / Self Care | Payer: Medicaid Other | Source: Ambulatory Visit | Attending: General Surgery | Admitting: General Surgery

## 2022-10-22 ENCOUNTER — Ambulatory Visit
Admission: RE | Admit: 2022-10-22 | Discharge: 2022-10-22 | Disposition: A | Discharge status: Home / Self Care | Payer: Medicaid Other | Source: Ambulatory Visit | Attending: Internal Medicine | Admitting: Internal Medicine

## 2022-10-22 DIAGNOSIS — M79621 Pain in right upper arm: Secondary | ICD-10-CM | POA: Insufficient documentation

## 2022-10-22 DIAGNOSIS — N644 Mastodynia: Secondary | ICD-10-CM | POA: Diagnosis not present

## 2022-10-26 ENCOUNTER — Encounter: Payer: Self-pay | Admitting: Occupational Therapy

## 2022-10-26 ENCOUNTER — Ambulatory Visit: Payer: Medicaid Other | Admitting: Occupational Therapy

## 2022-10-26 DIAGNOSIS — R0789 Other chest pain: Secondary | ICD-10-CM | POA: Diagnosis not present

## 2022-10-26 DIAGNOSIS — M79601 Pain in right arm: Secondary | ICD-10-CM

## 2022-10-26 DIAGNOSIS — M25611 Stiffness of right shoulder, not elsewhere classified: Secondary | ICD-10-CM

## 2022-10-26 DIAGNOSIS — L905 Scar conditions and fibrosis of skin: Secondary | ICD-10-CM | POA: Diagnosis not present

## 2022-10-26 NOTE — Therapy (Signed)
[facility] [address], [place], [address] Phone: [phone]   Fax:  [phone]  Occupational Therapy Treatment  Patient Details  Name: Angela Deleon MRN: [id number] Date of Birth: [birth year] Referring Provider (OT): Dr Windell Moment   Encounter Date: 10/26/2022   OT End of Session - 10/26/22 0929     Visit Number 2    Number of Visits 6    Date for OT Re-Evaluation 11/30/22    OT Start Time 0900    OT Stop Time 0926    OT Time Calculation (min) 26 min    Activity Tolerance Patient tolerated treatment well    Behavior During Therapy [facility] for tasks assessed/performed             Past Medical History:  Diagnosis Date   Acid reflux    Anxiety    Asthma    Breast cancer ([place]) 08/2022   right   Depression    Fibromyalgia    Hypertension, essential     Past Surgical History:  Procedure Laterality Date   BREAST BIOPSY Right    2006? neg   BREAST BIOPSY Right 08/13/2022   rt br u/s bx 11:00  venus clip path pending   BREAST BIOPSY Right 08/13/2022   [place] RT BREAST BX W LOC DEV 1ST LESION IMG BX SPEC US GUIDE 08/13/2022 [facility]   BREAST BIOPSY Right 08/13/2022   [place] RT BREAST BX W LOC DEV EA ADD LESION IMG BX SPEC US GUIDE 08/13/2022 [facility]   COLONOSCOPY     CYST EXCISION Right    arm   DIRECT LARYNGOSCOPY  11/14/2013   MASTECTOMY W/ SENTINEL NODE BIOPSY Right 08/27/2022   Procedure: MASTECTOMY WITH SENTINEL LYMPH NODE BIOPSY;  Surgeon: Herbert Pun, MD;  Location: [facility];  Service: General;  Laterality: Right;   TUBAL LIGATION  1992    There were no vitals filed for this visit.   Subjective Assessment - 10/26/22 0928     Pertinent History on 09/11/23 DR Windell Moment CHEST: Right chest without fluid collection. Right anterior shoulder seroma. The was aspirated with clear serous fluid. No sign of infection. Drain removed.   IMPRESSION:  Malignant neoplasm involving both  nipple and areola of right breast in female, estrogen receptor positive (C50.011, Z17.0) -S/p R mastectomy with sentinel node biopsy -Significantly decreased output from drain. Drain removed today. -Large fluid collection in anterior area of the shoulder. It was aspirated completely. No sign of infection. On 10/08/22 refer to OT for muscle pain s/p mastectomy    Patient Stated Goals I want the pain better in my arm and my chest that I can use my hand to bath and dress and do things around the house.    Currently in Pain? Yes    Pain Score --   with exercises and using a lot- resting can be 0/10 now   Pain Location Chest   anterior shoulder , axilla   Pain Orientation Right    Pain Descriptors / Indicators Tightness;Tender                [facility] OT Assessment - 10/26/22 0001       AROM   Right Shoulder Flexion 160 Degrees    Right Shoulder ABduction 155 Degrees             LYMPHEDEMA/ONCOLOGY QUESTIONNAIRE - 10/26/22 0001       Right Upper Extremity Lymphedema   15 cm Proximal to Olecranon Process 35.8 cm  10 cm Proximal to Olecranon Process 32 cm    Olecranon Process 24 cm             Patient arrived with great progress and right shoulder active range of motion in all planes.  As well as scar tissue and fibrosis on chest, axilla and anterior shoulder. Patient to continue with the same soft tissue massage, scar massage and desensitization exercises than last time. Making great progress area at the lateral scar healing more than last week.  Patient not tolerating rougher textures like towel as well as not bra. His pain reported resting.  Still pain at end range about 8/10 for shoulder abduction, flexion and external rotation.  Circumference in right upper extremity still same compared to the left and within normal limits. Upgrade patient's home program to using pulleys for active assisted range of motion flexion and abduction endrange 20 reps each. Followed by external  rotation and corner stretch 10 reps hold 5 seconds. All of them patient to stop with a slight pull Followed by active assisted range of motion on the wall for shoulder flexion and abduction 10 reps 2-3 times a day.                OT Education - 10/26/22 0929     Education Details progress and changes to HEP    Person(s) Educated Patient;Other (comment)    Methods Explanation;Demonstration;Tactile cues;Verbal cues;Handout    Comprehension Verbal cues required;Returned demonstration;Verbalized understanding              OT Short Term Goals - 10/19/22 1632       OT SHORT TERM GOAL #1   Title Patient to be independent in the home program to desensitize and improve scar tissue to right chest for patient to be able to wear bra and dry with a towel roll.    Baseline Cannot tolerate any soft massage, soft textures or drying with a towel.  Cannot wear a bra or even her her compression postmastectomy.    Time 4    Period Weeks    Status New    Target Date 11/16/22               OT Long Term Goals - 10/19/22 1633       OT LONG TERM GOAL #1   Title Right shoulder flexion and abduction improved to within functional limits for patient to pull top overhead and reach into cabinets with less discomfort and pain.    Baseline Pain in right shoulder and arm and chest 9/10 with shooting pins and needle into the arm and hide for sensitivity.  Patient do have fibromyalgia with pain prior to surgery.    Time 6    Period Weeks    Status New    Target Date 11/30/22                   Plan - 10/26/22 0930     Clinical Impression Statement Patient presented at OT evaluation 8-1/2 weeks post right mastectomy with a seroma on anterior shoulder that was draining 2 weeks postop.  Surgery was 08/27/2022.  Patient referred for muscle pain.  Patient presented OT evaluation with hypersensitivity over mastectomy scar as well as anterior shoulder where seroma was drained.  Patient  has area of 1 to 2 cm that still open on the lateral end of mastectomy scar.  Patient tender to massage, soft textures and rougher textures on scar and anterior shoulder.  Patient with decreased  shoulder flexion and abduction and external rotation with increased pain reported 9/10 with pins-and-needles and shooting pain down in the arm with reaching.  Patient to has a history of fibromyalgia.   Patient unable to tolerate bra or prosthesis on the right upper thoracic and chest. NOW pt with great progress since last week in bilateral shoulder AROM in all planes - decrease scar tissue and fibrosis in chest, axilla and anterior shoulder. Upgrade her HEP for end range AAROM and AAROM against gravity.   Patient can benefit from skilled OT services to decrease scar tissue and hypersensitivity.  As well as increased motion/ strength with less pain to be able to use L UE in ADLs and IADL's,    OT Occupational Profile and History Problem Focused Assessment - Including review of records relating to presenting problem    Occupational performance deficits (Please refer to evaluation for details): ADL's;IADL's;Rest and Sleep;Play;Social Participation;Leisure    Body Structure / Function / Physical Skills ADL;Fascial restriction;IADL;Strength;Pain;Edema;Scar mobility;ROM;UE functional use;Flexibility    Rehab Potential Fair    Clinical Decision Making Limited treatment options, no task modification necessary    Comorbidities Affecting Occupational Performance: May have comorbidities impacting occupational performance    Modification or Assistance to Complete Evaluation  No modification of tasks or assist necessary to complete eval    OT Frequency 1x / week    OT Duration 6 weeks    OT Treatment/Interventions Self-care/ADL training;Therapeutic exercise;Manual lymph drainage;Patient/family education;Scar mobilization;Passive range of motion;Manual Therapy    Consulted and Agree with Plan of Care Patient              Patient will benefit from skilled therapeutic intervention in order to improve the following deficits and impairments:   Body Structure / Function / Physical Skills: ADL, Fascial restriction, IADL, Strength, Pain, Edema, Scar mobility, ROM, UE functional use, Flexibility       Visit Diagnosis: Scar condition and fibrosis of skin  Stiffness of right shoulder, not elsewhere classified  Pain in right arm  Pain, chest wall    Problem List Patient Active Problem List   Diagnosis Date Noted   Genetic testing 09/29/2022   Aromatase inhibitor use 09/17/2022   Postoperative pain 09/17/2022   Breast cancer ([place]) 08/17/2022   Financial difficulties 01/21/2022   Family history of colon cancer in mother 04/13/2019   Essential hypertension 11/14/2018   Major depressive disorder 01/27/2018   Lower back pain 03/03/2017   Generalized pain 03/03/2017    Rosalyn Gess, OTR/L,CLT 10/26/2022, 9:34 AM  [facility] [address], [place], [address] Phone: [phone]   Fax:  [phone]  Name: Angela Deleon MRN: [id number] Date of Birth: May 22, 1961

## 2022-11-02 ENCOUNTER — Encounter: Payer: Self-pay | Admitting: *Deleted

## 2022-11-02 ENCOUNTER — Ambulatory Visit: Payer: Medicaid Other | Admitting: Occupational Therapy

## 2022-11-02 ENCOUNTER — Encounter: Payer: Self-pay | Admitting: Occupational Therapy

## 2022-11-02 DIAGNOSIS — M25611 Stiffness of right shoulder, not elsewhere classified: Secondary | ICD-10-CM | POA: Diagnosis not present

## 2022-11-02 DIAGNOSIS — M79601 Pain in right arm: Secondary | ICD-10-CM | POA: Diagnosis not present

## 2022-11-02 DIAGNOSIS — R0789 Other chest pain: Secondary | ICD-10-CM

## 2022-11-02 DIAGNOSIS — L905 Scar conditions and fibrosis of skin: Secondary | ICD-10-CM | POA: Diagnosis not present

## 2022-11-02 NOTE — Therapy (Signed)
[facility] [address] [place], [address] Phone: [phone]   Fax:  [phone]  Occupational Therapy Treatment  Patient Details  Name: Angela Deleon MRN: [id number] Date of Birth: Dec 16, 1960 Referring Provider (OT): Dr Windell Moment   Encounter Date: 11/02/2022   OT End of Session - 11/02/22 1329     Visit Number 3    Number of Visits 6    Date for OT Re-Evaluation 11/30/22    OT Start Time 0900    OT Stop Time 0939    OT Time Calculation (min) 39 min    Activity Tolerance Patient tolerated treatment well    Behavior During Therapy [facility] for tasks assessed/performed             Past Medical History:  Diagnosis Date   Acid reflux    Anxiety    Asthma    Breast cancer ([place]) 08/2022   right   Depression    Fibromyalgia    Hypertension, essential     Past Surgical History:  Procedure Laterality Date   BREAST BIOPSY Right    2006? neg   BREAST BIOPSY Right 08/13/2022   rt br u/s bx 11:00  venus clip path pending   BREAST BIOPSY Right 08/13/2022   [place] RT BREAST BX W LOC DEV 1ST LESION IMG BX SPEC US GUIDE 08/13/2022 [facility]   BREAST BIOPSY Right 08/13/2022   [place] RT BREAST BX W LOC DEV EA ADD LESION IMG BX SPEC US GUIDE 08/13/2022 [facility]   COLONOSCOPY     CYST EXCISION Right    arm   DIRECT LARYNGOSCOPY  11/14/2013   MASTECTOMY W/ SENTINEL NODE BIOPSY Right 08/27/2022   Procedure: MASTECTOMY WITH SENTINEL LYMPH NODE BIOPSY;  Surgeon: Herbert Pun, MD;  Location: [facility];  Service: General;  Laterality: Right;   TUBAL LIGATION  1992    There were no vitals filed for this visit.   Subjective Assessment - 11/02/22 1328     Subjective  I am doing better with motion and I using my arms better.  Just, stomach this morning.  I can touch my scar I am going to get fitted with a prosthesis and bra today.  I can pick up things like a gallon of water.  Is just the scar in the front  of my shoulder still bothers me.    Pertinent History on 09/11/23 DR Cintron-Diaz CHEST: Right chest without fluid collection. Right anterior shoulder seroma. The was aspirated with clear serous fluid. No sign of infection. Drain removed.   IMPRESSION:  Malignant neoplasm involving both nipple and areola of right breast in female, estrogen receptor positive (C50.011, Z17.0) -S/p R mastectomy with sentinel node biopsy -Significantly decreased output from drain. Drain removed today. -Large fluid collection in anterior area of the shoulder. It was aspirated completely. No sign of infection. On 10/08/22 refer to OT for muscle pain s/p mastectomy    Patient Stated Goals I want the pain better in my arm and my chest that I can use my hand to bath and dress and do things around the house.    Currently in Pain? Yes    Pain Score --   anterior shoulder - scar massage - no nr provided               [facility] OT Assessment - 11/02/22 0001       AROM   Right Shoulder Flexion 170 Degrees    Right Shoulder ABduction 165  Degrees             LYMPHEDEMA/ONCOLOGY QUESTIONNAIRE - 11/02/22 0001       Right Upper Extremity Lymphedema   15 cm Proximal to Olecranon Process 37.5 cm    10 cm Proximal to Olecranon Process 32 cm    Olecranon Process 24.5 cm      Left Upper Extremity Lymphedema   15 cm Proximal to Olecranon Process 36 cm    10 cm Proximal to Olecranon Process 32 cm    Olecranon Process 24 cm                  Patient arrived with great progress and right shoulder active range of motion in all planes.  As well as scar tissue and fibrosis on chest and axilla- pt to keep arm over head now when doing scar massage. Patient to continue with the same soft tissue massage, scar massage and desensitization exercises than last visits. Making great progress area at the lateral scar healing- close today.  Tolerating towel and getting fitted for bra today. No pain reported resting.  Still pain  at end range for shoulder horizontal ABD pull over anterior shoulder - scar massage done -still adhere and tender - mini massager and xtractor done few reps -kinesiotape add to HEP at 100% pull - star of 3 tapes to facilitate some scar mobs during AROM and use.    Pt to cont with pulleys for active assisted range of motion flexion and abduction endrange 20 reps each. Followed by external rotation and corner stretch 10 reps hold 5 seconds. All of them patient to stop with a slight pull Followed by active assisted range of motion on the wall for shoulder flexion and abduction 10 reps Add this date RTB for scapula retraction and shoulder ext  12 reps 2 x day  Or 2 x 12 reps - 1 x day  Pain free  Pt to follow up 2-3 sessions dep on progress                  OT Education - 11/02/22 1329     Education Details progress and changes to HEP    Person(s) Educated Patient;Other (comment)    Methods Explanation;Demonstration;Tactile cues;Verbal cues;Handout    Comprehension Verbal cues required;Returned demonstration;Verbalized understanding              OT Short Term Goals - 10/19/22 1632       OT SHORT TERM GOAL #1   Title Patient to be independent in the home program to desensitize and improve scar tissue to right chest for patient to be able to wear bra and dry with a towel roll.    Baseline Cannot tolerate any soft massage, soft textures or drying with a towel.  Cannot wear a bra or even her her compression postmastectomy.    Time 4    Period Weeks    Status New    Target Date 11/16/22               OT Long Term Goals - 10/19/22 1633       OT LONG TERM GOAL #1   Title Right shoulder flexion and abduction improved to within functional limits for patient to pull top overhead and reach into cabinets with less discomfort and pain.    Baseline Pain in right shoulder and arm and chest 9/10 with shooting pins and needle into the arm and hide for sensitivity.  Patient do  have fibromyalgia  with pain prior to surgery.    Time 6    Period Weeks    Status New    Target Date 11/30/22                   Plan - 11/02/22 1330     Clinical Impression Statement Pt is 9 1/2 wks s/p R mastectomy with a seroma on anterior shoulder that was drained.  Surgery was 08/27/2022.  Patient referred for muscle pain.  At eval she had hypersensitivity over mastectomy scar as well as anterior shoulder where seroma was drained.  Patient had area of 1 to 2 cm that was still open on the lateral end of mastectomy scar.  Patient was tender to massage, soft textures and rougher textures on scar and anterior shoulder.  Pain was 9/10 with decrease shoulder AROM in all planes. Pt to has a history of fibromyalgia.  Pt was unable to tolerate bra or prosthesis on the right upper thoracic and chest. NOW in 2 sessions she shows R shoulder AROM WNL and circumference in R UE WNL compare to L. Pain improved greatly except with scar massage on anterior shoulder where seroma was drained - review scar management again. Add some strengthening to HEP and she needs to cont with AAROM HEP. Pt getting fitted for prosthesis and bras today per pt.   Patient can benefit from skilled OT services to decrease scar tissue and hypersensitivity.  As well as increased motion/ strength with less pain to be able to use L UE in ADLs and IADL's,    OT Occupational Profile and History Problem Focused Assessment - Including review of records relating to presenting problem    Occupational performance deficits (Please refer to evaluation for details): ADL's;IADL's;Rest and Sleep;Play;Social Participation;Leisure    Body Structure / Function / Physical Skills ADL;Fascial restriction;IADL;Strength;Pain;Edema;Scar mobility;ROM;UE functional use;Flexibility    Rehab Potential Fair    Clinical Decision Making Limited treatment options, no task modification necessary    Comorbidities Affecting Occupational Performance: May have  comorbidities impacting occupational performance    Modification or Assistance to Complete Evaluation  No modification of tasks or assist necessary to complete eval    OT Frequency 1x / week    OT Duration 6 weeks    OT Treatment/Interventions Self-care/ADL training;Therapeutic exercise;Manual lymph drainage;Patient/family education;Scar mobilization;Passive range of motion;Manual Therapy    Consulted and Agree with Plan of Care Patient             Patient will benefit from skilled therapeutic intervention in order to improve the following deficits and impairments:   Body Structure / Function / Physical Skills: ADL, Fascial restriction, IADL, Strength, Pain, Edema, Scar mobility, ROM, UE functional use, Flexibility       Visit Diagnosis: Scar condition and fibrosis of skin  Stiffness of right shoulder, not elsewhere classified  Pain in right arm  Pain, chest wall    Problem List Patient Active Problem List   Diagnosis Date Noted   Genetic testing 09/29/2022   Aromatase inhibitor use 09/17/2022   Postoperative pain 09/17/2022   Breast cancer ([place]) 08/17/2022   Financial difficulties 01/21/2022   Family history of colon cancer in mother 04/13/2019   Essential hypertension 11/14/2018   Major depressive disorder 01/27/2018   Lower back pain 03/03/2017   Generalized pain 03/03/2017    Rosalyn Gess, OTR/L,CLT 11/02/2022, 1:36 PM  [facility] [address], [place], [address] Phone: [phone]   Fax:  [phone]  Name: Angela Deleon MRN: [id number] Date of Birth: Oct 30, 1960

## 2022-11-02 NOTE — Progress Notes (Signed)
Received a call from [employer] asking about patient assistance that she had filled out paperwork for.  I spoke with Gabriel Cirri and she needs to come sign paperwork and bring in proof of income.  Maridel said she would come by Thursday to sign the paperwork.

## 2022-11-03 DIAGNOSIS — F411 Generalized anxiety disorder: Secondary | ICD-10-CM | POA: Diagnosis not present

## 2022-11-05 ENCOUNTER — Other Ambulatory Visit: Payer: Disability Insurance

## 2022-11-09 ENCOUNTER — Ambulatory Visit: Payer: Medicaid Other | Admitting: Occupational Therapy

## 2022-11-09 DIAGNOSIS — L905 Scar conditions and fibrosis of skin: Secondary | ICD-10-CM

## 2022-11-09 DIAGNOSIS — R0789 Other chest pain: Secondary | ICD-10-CM

## 2022-11-09 DIAGNOSIS — M25611 Stiffness of right shoulder, not elsewhere classified: Secondary | ICD-10-CM | POA: Diagnosis not present

## 2022-11-09 DIAGNOSIS — M79601 Pain in right arm: Secondary | ICD-10-CM

## 2022-11-09 NOTE — Therapy (Signed)
[facility] [address] [place], [address] Phone: [phone]   Fax:  [phone]  Occupational Therapy Treatment  Patient Details  Name: Angela Deleon MRN: [id number] Date of Birth: 1960-11-25 Referring Provider (OT): Dr Windell Moment   Encounter Date: 11/09/2022   OT End of Session - 11/09/22 0924     Visit Number 4    Number of Visits 6    Date for OT Re-Evaluation 11/30/22    OT Start Time 0855    OT Stop Time 0916    OT Time Calculation (min) 21 min    Activity Tolerance Patient tolerated treatment well    Behavior During Therapy [facility] for tasks assessed/performed             Past Medical History:  Diagnosis Date   Acid reflux    Anxiety    Asthma    Breast cancer ([place]) 08/2022   right   Depression    Fibromyalgia    Hypertension, essential     Past Surgical History:  Procedure Laterality Date   BREAST BIOPSY Right    2006? neg   BREAST BIOPSY Right 08/13/2022   rt br u/s bx 11:00  venus clip path pending   BREAST BIOPSY Right 08/13/2022   [place] RT BREAST BX W LOC DEV 1ST LESION IMG BX SPEC US GUIDE 08/13/2022 [facility]   BREAST BIOPSY Right 08/13/2022   [place] RT BREAST BX W LOC DEV EA ADD LESION IMG BX SPEC US GUIDE 08/13/2022 [facility]   COLONOSCOPY     CYST EXCISION Right    arm   DIRECT LARYNGOSCOPY  11/14/2013   MASTECTOMY W/ SENTINEL NODE BIOPSY Right 08/27/2022   Procedure: MASTECTOMY WITH SENTINEL LYMPH NODE BIOPSY;  Surgeon: Herbert Pun, MD;  Location: [facility];  Service: General;  Laterality: Right;   TUBAL LIGATION  1992    There were no vitals filed for this visit.   Subjective Assessment - 11/09/22 0922     Subjective  Doing so much beter- waiting for my bra's to came in - seen Dr Peyton Najjar and have appt with DR A Friday- doing more around the house- pain much better - still doing some massage to scar at shoulder and do feel slight pull at the bottom of my  shoulder blade    Pertinent History on 09/11/23 DR Cintron-Diaz CHEST: Right chest without fluid collection. Right anterior shoulder seroma. The was aspirated with clear serous fluid. No sign of infection. Drain removed.   IMPRESSION:  Malignant neoplasm involving both nipple and areola of right breast in female, estrogen receptor positive (C50.011, Z17.0) -S/p R mastectomy with sentinel node biopsy -Significantly decreased output from drain. Drain removed today. -Large fluid collection in anterior area of the shoulder. It was aspirated completely. No sign of infection. On 10/08/22 refer to OT for muscle pain s/p mastectomy    Patient Stated Goals I want the pain better in my arm and my chest that I can use my hand to bath and dress and do things around the house.    Currently in Pain? Yes    Pain Score 1     Pain Location Scapula    Pain Orientation Right    Pain Descriptors / Indicators Tightness    Pain Type Surgical pain                 LYMPHEDEMA/ONCOLOGY QUESTIONNAIRE - 11/09/22 0001       Right Upper Extremity Lymphedema  15 cm Proximal to Olecranon Process 37 cm    10 cm Proximal to Olecranon Process 32.5 cm    Olecranon Process 24 cm      Left Upper Extremity Lymphedema   15 cm Proximal to Olecranon Process 36.5 cm    10 cm Proximal to Olecranon Process 31.5 cm    Olecranon Process 24.4 cm              Patient arrived with great progress  in 4 sessions since eval R shoulder active range of motion WNL in all planes.  As well as scar tissue and fibrosis on chest and axilla progress greatly - pt to keep arm over head now when doing scar massage.  Can tolerate massage and different textures - was fitted with bra's. Pt to cont with scar massage on anterior shoulder R - and kinesiotape- progressing Await for them to get in No pain with shoulder AROM in all planes Add child pose to do 5-8reps hold 10 sec- to  L and R for Lat stretch    Pt to cont with pulleys for  active assisted range of motion flexion and abduction endrange 20 reps each as needed external rotation and corner stretch 10 reps hold 5 seconds as needed All of them patient to stop with a slight pull Cont with RTB for scapula retraction and shoulder ext  2 x 12 reps 2 x day  Pain free  circumference doing well - WNL compare to R to L  Pt to follow up in 3 wks if needed                        OT Education - 11/09/22 0924     Education Details progress and changes to HEP    Person(s) Educated Patient;Other (comment)    Methods Explanation;Demonstration;Tactile cues;Verbal cues;Handout    Comprehension Verbal cues required;Returned demonstration;Verbalized understanding              OT Short Term Goals - 10/19/22 1632       OT SHORT TERM GOAL #1   Title Patient to be independent in the home program to desensitize and improve scar tissue to right chest for patient to be able to wear bra and dry with a towel roll.    Baseline Cannot tolerate any soft massage, soft textures or drying with a towel.  Cannot wear a bra or even her her compression postmastectomy.    Time 4    Period Weeks    Status New    Target Date 11/16/22               OT Long Term Goals - 10/19/22 1633       OT LONG TERM GOAL #1   Title Right shoulder flexion and abduction improved to within functional limits for patient to pull top overhead and reach into cabinets with less discomfort and pain.    Baseline Pain in right shoulder and arm and chest 9/10 with shooting pins and needle into the arm and hide for sensitivity.  Patient do have fibromyalgia with pain prior to surgery.    Time 6    Period Weeks    Status New    Target Date 11/30/22                    Patient will benefit from skilled therapeutic intervention in order to improve the following deficits and impairments:  Visit Diagnosis: Scar condition and fibrosis of skin  Stiffness of right  shoulder, not elsewhere classified  Pain, chest wall  Pain in right arm    Problem List Patient Active Problem List   Diagnosis Date Noted   Genetic testing 09/29/2022   Aromatase inhibitor use 09/17/2022   Postoperative pain 09/17/2022   Breast cancer ([place]) 08/17/2022   Financial difficulties 01/21/2022   Family history of colon cancer in mother 04/13/2019   Essential hypertension 11/14/2018   Major depressive disorder 01/27/2018   Lower back pain 03/03/2017   Generalized pain 03/03/2017    Rosalyn Gess, OTR/L,CLT 11/09/2022, 9:25 AM  [facility] [address], [place], [address] Phone: [phone]   Fax:  [phone]  Name: Angela Deleon MRN: [id number] Date of Birth: 1960-10-24

## 2022-11-11 NOTE — Progress Notes (Signed)
Enrolled patient into the [facility]

## 2022-11-13 ENCOUNTER — Encounter: Payer: Self-pay | Admitting: *Deleted

## 2022-11-13 ENCOUNTER — Inpatient Hospital Stay: Payer: Medicaid Other | Attending: Internal Medicine | Admitting: Internal Medicine

## 2022-11-13 ENCOUNTER — Encounter: Payer: Self-pay | Admitting: Internal Medicine

## 2022-11-13 VITALS — BP 148/70 | HR 66 | Temp 97.8°F | Resp 19 | Wt 195.3 lb

## 2022-11-13 DIAGNOSIS — Z4431 Encounter for fitting and adjustment of external right breast prosthesis: Secondary | ICD-10-CM | POA: Diagnosis not present

## 2022-11-13 DIAGNOSIS — Z170 Estrogen receptor positive status [ER+]: Secondary | ICD-10-CM | POA: Insufficient documentation

## 2022-11-13 DIAGNOSIS — C50411 Malignant neoplasm of upper-outer quadrant of right female breast: Secondary | ICD-10-CM | POA: Diagnosis present

## 2022-11-13 DIAGNOSIS — I10 Essential (primary) hypertension: Secondary | ICD-10-CM | POA: Diagnosis not present

## 2022-11-13 DIAGNOSIS — Z800 Family history of malignant neoplasm of digestive organs: Secondary | ICD-10-CM | POA: Diagnosis not present

## 2022-11-13 DIAGNOSIS — C50911 Malignant neoplasm of unspecified site of right female breast: Secondary | ICD-10-CM

## 2022-11-13 DIAGNOSIS — Z8041 Family history of malignant neoplasm of ovary: Secondary | ICD-10-CM | POA: Diagnosis not present

## 2022-11-13 DIAGNOSIS — F17210 Nicotine dependence, cigarettes, uncomplicated: Secondary | ICD-10-CM | POA: Diagnosis not present

## 2022-11-13 DIAGNOSIS — Z9011 Acquired absence of right breast and nipple: Secondary | ICD-10-CM | POA: Insufficient documentation

## 2022-11-13 DIAGNOSIS — Z79811 Long term (current) use of aromatase inhibitors: Secondary | ICD-10-CM | POA: Diagnosis not present

## 2022-11-13 DIAGNOSIS — F419 Anxiety disorder, unspecified: Secondary | ICD-10-CM | POA: Insufficient documentation

## 2022-11-13 DIAGNOSIS — C50011 Malignant neoplasm of nipple and areola, right female breast: Secondary | ICD-10-CM | POA: Diagnosis not present

## 2022-11-13 MED ORDER — LETROZOLE 2.5 MG PO TABS: 2.5000 mg | ORAL_TABLET | Freq: Every day | ORAL | 2 refills | Status: AC

## 2022-11-13 NOTE — Progress Notes (Signed)
[name] CONSULT NOTE  Patient Care Team: Pcp, No as PCP - General Daiva Huge, RN as Oncology Nurse Navigator   CANCER STAGING   Cancer Staging  Breast cancer [facility]) Staging form: Breast, AJCC 8th Edition - Pathologic stage from 08/27/2022: Stage IB (pT2, pN1a(sn), cM0, G2, ER+, PR+, HER2-, Oncotype DX score: 5) - Signed by Jane Canary, MD on 09/17/2022 Stage prefix: Initial diagnosis Method of lymph node assessment: Sentinel lymph node biopsy Multigene prognostic tests performed: Oncotype DX Recurrence score range: Less than 11 Histologic grading system: 3 grade system   ASSESSMENT & PLAN:  Angela Deleon 62 y.o. female with pmh of fibromyalgia, hypertension and depression was seen in medical oncology to discuss treatment for right-sided breast cancer.  # Right breast invasive ductal cancer, pathologic stage Ib (pT2N1) ER/PR+, HER2- -self palpable. S/p US guided biopsy on 08/13/2022.  -s/p right-sided mastectomy with SLNB by Dr. Peyton Najjar on 08/27/2022.  Pathology showed 2.8 cm invasive mammary cancer upper outer quadrant, grade 2, 2 smaller foci in the lower outer quadrant measuring 5 mm and 2.5 mm grade 2, margins negative, 1/4 lymph node positive for macrometastatic disease 3.2 mm, ENE negative, no LVI, pathologic stage pT2 N1a   -Oncotype DX testing showed low recurrence score of 5.  So there is no benefit of chemotherapy.  Was seen by Dr. Baruch Gouty and no role for radiation.  -DEXA scan to obtain baseline bone health. No show to appt scheduled on 2/22. Will alert the staff to reschedule.   -Patient is feeling better today.  Recovering from surgery well.  Will proceed with letrozole 2.5 mg daily.  Plan for total 5 years.  Side effects were discussed today causing myalgias, arthralgias, hot flashes, thinning of the bone, elevated cholesterol.  Patient has baseline hot flashes and joint pain.  Will monitor.  She is taking calcium 600 mg daily.  Advised to add vitamin  D 1000 units.  # Family history of colon, uterine and ovarian cancer -[facility] gene panel from 09/29/2022 negative.  # Anxiety -Patient follows with [facility].  She is on multiple medications-Klonopin, Lexapro, Wellbutrin, Seroquel.   No orders of the defined types were placed in this encounter.  RTC in 6 weeks for md visit, letrozole toxicity check   The total time spent in the appointment was 30 minutes encounter with patients including review of chart and various tests results, discussions about plan of care and coordination of care plan   All questions were answered. The patient knows to call the clinic with any problems, questions or concerns. No barriers to learning was detected.  Jane Canary, MD 3/1/202412:30 PM   HISTORY OF PRESENTING ILLNESS:  Angela Deleon 62 y.o. female with pmh of fibromyalgia, hypertension and depression was seen in medical oncology to discuss treatment for right-sided breast cancer.  Interval history Patient was seen today accompanied by husband for follow-up. She is feeling much better than the last visit.  The swelling in the axilla has improved.  Pain has improved.  He is planning to undergo her screening colonoscopy and screening CT scan with her history of smoking.  Reports feeling fatigued midday which has been ongoing.  I have reviewed her chart and materials related to her cancer extensively and collaborated history with the patient. Summary of oncologic history is as follows: Oncology History  Breast cancer ([place])  07/15/2022 Initial Diagnosis   Patient noticed a palpable lump in the right upper outer quadrant of the breast.  Last mammogram was  in 2007.   07/27/2022 Mammogram   Diagnostic mammogram and US  IMPRESSION: 1. Highly suspicious mass in the RIGHT breast at the 11 o'clock axis, 3 cm from the nipple, measuring 1.9 cm, corresponding to the patient's palpable area of concern. Ultrasound-guided biopsy is recommended. 2.  Several additional hypoechoic areas adjacent to the dominant RIGHT breast mass, suspected satellite masses, most peripheral component located at the 10 o'clock axis, 5 cm from the nipple, measuring 1 cm. Ultrasound-guided biopsy is recommended this most peripheral hypoechoic area/satellite mass. 3. No evidence of malignancy within the LEFT breast.   08/13/2022 Pathology Results   DIAGNOSIS: A.  BREAST, RIGHT 10:00 5 CMFN (HEART); ULTRASOUND-GUIDED BIOPSY: - INVASIVE MAMMARY CARCINOMA, NO SPECIAL TYPE. Size of invasive carcinoma: 6 mm in this sample Histologic grade of invasive carcinoma: Grade 1                      Glandular/tubular differentiation score: 2                      Nuclear pleomorphism score: 2                      Mitotic rate score: 1                      Total score: 5 Ductal carcinoma in situ: Not identified Lymphovascular invasion: Not identified ER/PR/HER2: Immunohistochemistry will be performed on block A1, with reflex to [place] for HER2 2+. The results will be reported in an addendum.  B.  BREAST, RIGHT 11:00 3 CMFN (VENUS); ULTRASOUND-GUIDED BIOPSY: - INVASIVE MAMMARY CARCINOMA, NO SPECIAL TYPE, INVOLVING ORGANIZING FAT NECROSIS. Size of invasive carcinoma: 12 mm in this sample Histologic grade of invasive carcinoma: Grade 2                      Glandular/tubular differentiation score: 3                      Nuclear pleomorphism score: 2                      Mitotic rate score: 1                      Total score: 6 Ductal carcinoma in situ: Not identified Lymphovascular invasion: Not identified   ADDENDUM:  CASE SUMMARY: BREAST BIOMARKER TESTS - PART A: RIGHT 10:00 5 CMFN  (HEART)  Estrogen Receptor (ER) Status: POSITIVE          Percentage of cells with nuclear positivity: Greater than 90%          Average intensity of staining: Strong   Progesterone Receptor (PgR) Status: POSITIVE          Percentage of cells with nuclear positivity: 51-90%           Average intensity of staining: Moderate   HER2 (by immunohistochemistry): NEGATIVE (Score 0)   Ki-67: Not performed   CASE SUMMARY: BREAST BIOMARKER TESTS - PART B: RIGHT 11:00 3 CMFN (VENUS) Estrogen Receptor (ER) Status: POSITIVE         Percentage of cells with nuclear positivity: Greater than 90%         Average intensity of staining: Strong  Progesterone Receptor (PgR) Status: POSITIVE         Percentage of cells with nuclear positivity: 11-50%  Average intensity of staining: Moderate  HER2 (by immunohistochemistry): NEGATIVE (Score 0)    08/27/2022 Definitive Surgery   Right mastectomy with SLNB by Dr. Peyton Najjar  Pathology showed 2.8 cm invasive mammary cancer upper outer quadrant, grade 2, 2 smaller foci in the lower outer quadrant measuring 5 mm and 2.5 mm grade 2, margins negative, 1/4 lymph node positive for macrometastatic disease 3.2 mm, ENE negative, no LVI, pathologic stage pT2 N1a     08/27/2022 Oncotype testing   Oncotype DX recurrence score 5.  No benefit from chemotherapy.    Genetic Testing   Negative genetic testing. No pathogenic variants identified on the [facility] Common Hereditary Cancers+RNA panel. The report date is 09/27/2022.  The Common Hereditary Cancers Panel + RNA offered by [facility] includes sequencing and/or deletion duplication testing of the following 48 genes: APC*, ATM*, AXIN2, BAP1, BARD1, BMPR1A, BRCA1, BRCA2, BRIP1, CDH1, CDK4, CDKN2A (p14ARF), CDKN2A (p16INK4a), CHEK2, CTNNA1, DICER1*, EPCAM*, FH*, GREM1*, HOXB13, KIT, MBD4, MEN1*, MLH1*, MSH2*, MSH3*, MSH6*, MUTYH, NF1*, NTHL1, PALB2, PDGFRA, PMS2*, POLD1*, POLE, PTEN*, RAD51C, RAD51D, SDHA*, SDHB, SDHC*, SDHD, SMAD4, SMARCA4, STK11, TP53, TSC1*, TSC2, VHL.     Menarche age 87 Age at first birth 89 Birth control yes in 29s Menopause age 30 HRT no  MEDICAL HISTORY:  Past Medical History:  Diagnosis Date   Acid reflux    Anxiety    Asthma    Breast cancer ([place]) 08/2022   right    Depression    Fibromyalgia    Hypertension, essential     SURGICAL HISTORY: Past Surgical History:  Procedure Laterality Date   BREAST BIOPSY Right    2006? neg   BREAST BIOPSY Right 08/13/2022   rt br u/s bx 11:00  venus clip path pending   BREAST BIOPSY Right 08/13/2022   [place] RT BREAST BX W LOC DEV 1ST LESION IMG BX SPEC US GUIDE 08/13/2022 [facility]   BREAST BIOPSY Right 08/13/2022   [place] RT BREAST BX W LOC DEV EA ADD LESION IMG BX SPEC US GUIDE 08/13/2022 [facility]   COLONOSCOPY     CYST EXCISION Right    arm   DIRECT LARYNGOSCOPY  11/14/2013   MASTECTOMY W/ SENTINEL NODE BIOPSY Right 08/27/2022   Procedure: MASTECTOMY WITH SENTINEL LYMPH NODE BIOPSY;  Surgeon: Herbert Pun, MD;  Location: [facility];  Service: General;  Laterality: Right;   TUBAL LIGATION  1992    SOCIAL HISTORY: Social History   Socioeconomic History   Marital status: Single    Spouse name: Not on file   Number of children: Not on file   Years of education: Not on file   Highest education level: Not on file  Occupational History   Not on file  Tobacco Use   Smoking status: Every Day    Packs/day: 0.50    Types: Cigarettes   Smokeless tobacco: Never  Vaping Use   Vaping Use: Never used  Substance and Sexual Activity   Alcohol use: Not Currently    Comment: social drinker   Drug use: Yes    Types: Marijuana   Sexual activity: Yes    Birth control/protection: Post-menopausal  Other Topics Concern   Not on file  Social History Narrative   Lives with fiance in a camper   Social Determinants of Health   Financial Resource Strain: High Risk (08/19/2022)   Overall Financial Resource Strain (CARDIA)    Difficulty of Paying Living Expenses: Hard  Food Insecurity: No Food Insecurity (08/28/2022)   Hunger Vital Sign  Worried About Charity fundraiser in the Last Year: Never true    [place] in the Last Year: Never true  Recent Concern: [place] Present (08/19/2022)   Hunger Vital Sign    Worried About Running Out of Food in the Last Year: Sometimes true    Ran Out of Food in the Last Year: Sometimes true  Transportation Needs: Unmet Transportation Needs (08/28/2022)   PRAPARE - Hydrologist (Medical): Yes    Lack of Transportation (Non-Medical): Yes  Physical Activity: Inactive (08/19/2022)   Exercise Vital Sign    Days of Exercise per Week: 0 days    Minutes of Exercise per Session: 0 min  Stress: Stress Concern Present (08/19/2022)   [place]    Feeling of Stress : To some extent  Social Connections: Moderately Isolated (08/19/2022)   Social Connection and Isolation Panel [NHANES]    Frequency of Communication with Friends and Family: Three times a week    Frequency of Social Gatherings with Friends and Family: Once a week    Attends Religious Services: Never    Marine scientist or Organizations: No    Attends Archivist Meetings: Never    Marital Status: Living with partner  Intimate Partner Violence: Not At Risk (08/28/2022)   Humiliation, Afraid, Rape, and Kick questionnaire    Fear of Current or Ex-Partner: No    Emotionally Abused: No    Physically Abused: No    Sexually Abused: No    FAMILY HISTORY: Family History  Problem Relation Age of Onset   Colon cancer Mother 72   Ovarian cancer Mother 80   Cancer Brother        unk type   Ovarian cancer Maternal Grandmother    Esophageal cancer Half-Brother     ALLERGIES:  is allergic to iodinated contrast media and penicillins.  MEDICATIONS:  Current Outpatient Medications  Medication Sig Dispense Refill   albuterol (VENTOLIN HFA) 108 (90 Base) MCG/ACT inhaler Inhale 2 puffs into the lungs every 6 (six) hours as needed.     atorvastatin (LIPITOR) 20 MG tablet Take 20 mg by mouth daily.     buPROPion (WELLBUTRIN XL) 150 MG 24 hr tablet Take  150 mg by mouth every morning.     clonazePAM (KLONOPIN) 1 MG tablet Take 1 mg by mouth 2 (two) times daily as needed for anxiety.     doxycycline (VIBRA-TABS) 100 MG tablet Take 100 mg by mouth 2 (two) times daily.     famotidine (PEPCID) 20 MG tablet Take 20 mg by mouth daily.     gabapentin (NEURONTIN) 400 MG capsule Take 400 mg by mouth 3 (three) times daily.     ibuprofen (ADVIL) 200 MG tablet Take 600 mg by mouth daily as needed.     letrozole (FEMARA) 2.5 MG tablet Take 1 tablet (2.5 mg total) by mouth daily. 90 tablet 2   lisinopril-hydrochlorothiazide (ZESTORETIC) 20-12.5 MG tablet Take 1 tablet by mouth daily. 30 tablet 1   methocarbamol (ROBAXIN) 500 MG tablet Take 1 tablet (500 mg total) by mouth 4 (four) times daily. 15 tablet 0   oxyCODONE-acetaminophen (PERCOCET) 5-325 MG tablet Take 1 tablet by mouth every 4 (four) hours as needed for severe pain. 20 tablet 0   QUEtiapine (SEROQUEL) 50 MG tablet Take 50 mg by mouth at bedtime.     sertraline (ZOLOFT) 100 MG  tablet Take 150 mg by mouth daily.     escitalopram (LEXAPRO) 20 MG tablet Take 20 mg by mouth daily. (Patient not taking: Reported on 08/27/2022)     SUMAtriptan (IMITREX) 50 MG tablet Take 1 tablet (50 mg total) by mouth once for 1 dose. May repeat in 2 hours if headache persists or recurs. 10 tablet 0   No current facility-administered medications for this visit.    REVIEW OF SYSTEMS:   Pertinent information mentioned in HPI All other systems were reviewed with the patient and are negative.  PHYSICAL EXAMINATION: ECOG PERFORMANCE STATUS: 0 - Asymptomatic  Vitals:   11/13/22 1018  BP: (!) 148/70  Pulse: 66  Resp: 19  Temp: 97.8 F (36.6 C)  SpO2: 99%     Filed Weights   11/13/22 1018  Weight: 195 lb 4.8 oz (88.6 kg)      GENERAL:alert, no distress and comfortable SKIN: skin color, texture, turgor are normal, no rashes or significant lesions EYES: normal, conjunctiva are pink and non-injected, sclera  clear OROPHARYNX:no exudate, no erythema and lips, buccal mucosa, and tongue normal  NECK: supple, thyroid normal size, non-tender, without nodularity LYMPH:  no palpable lymphadenopathy in the cervical, axillary or inguinal LUNGS: clear to auscultation and percussion with normal breathing effort HEART: regular rate & rhythm and no murmurs and no lower extremity edema ABDOMEN:abdomen soft, non-tender and normal bowel sounds Musculoskeletal:no cyanosis of digits and no clubbing  PSYCH: alert & oriented x 3 with fluent speech NEURO: no focal motor/sensory deficits  Right breat-suture site healing well.  On the medial aspect slightly open but no signs of infection noted.  Bruising in the right axilla. Left breast, small movable pea-sized palpable tender area in the left breast in the lower outer quadrant.   LABORATORY DATA:  I have reviewed the data as listed Lab Results  Component Value Date   WBC 15.6 (H) 08/28/2022   HGB 12.3 08/28/2022   HCT 39.3 08/28/2022   MCV 88.1 08/28/2022   PLT 208 08/28/2022   Recent Labs    01/21/22 1301 08/26/22 1111 08/28/22 0644  NA 135 138 137  K 3.9 4.1 4.2  CL 104 106 106  CO2 '24 28 24  '$ GLUCOSE 98 92 118*  BUN '14 18 19  '$ CREATININE 0.85 0.74 0.78  CALCIUM 8.8* 8.8* 8.6*  GFRNONAA >60 >60 >60    RADIOGRAPHIC STUDIES: I have personally reviewed the radiological images as listed and agreed with the findings in the report. MM DIAG BREAST TOMO UNI LEFT  Result Date: 10/22/2022 CLINICAL DATA:  Focal LEFT breast pain. Status post interval RIGHT mastectomy. EXAM: DIGITAL DIAGNOSTIC UNILATERAL LEFT MAMMOGRAM WITH TOMOSYNTHESIS; ULTRASOUND LEFT BREAST LIMITED TECHNIQUE: Left digital diagnostic mammography and breast tomosynthesis was performed.; Targeted ultrasound examination of the left breast was performed. COMPARISON:  Previous exam(s). ACR Breast Density Category b: There are scattered areas of fibroglandular density. FINDINGS: Spot compression  tomosynthesis views were obtained over the area of focal pain in the LEFT breast. No suspicious mammographic finding is identified in this area. No suspicious mass, microcalcification, or other finding is identified in the LEFT breast. On physical exam, no suspicious mass is appreciated. Targeted LEFT breast ultrasound was performed in the area of pain at the lower breast. No suspicious solid or cystic mass is identified. No suspicious findings to explain the patient's symptoms. IMPRESSION: 1. No mammographic or sonographic evidence of malignancy at the site of painful concern in the LEFT breast. Any further workup or further  cross-sectional imaging of the patient's symptoms should be based on the clinical assessment. Recommend annual mammogram in 1 year. 2. Breast pain is a common condition, which will often resolve on its own without intervention. It can be affected by hormonal changes, medication side effect, weight changes and fit of the bra. Pain may also be referred from other adjacent areas of the body. Breast pain may be improved by wearing adequate well-fitting support, over-the-counter topical and oral NSAID medication, low-fat diet, and ice/heat as needed. Studies have shown an improvement in cyclic pain with use of evening primrose oil, vitamin D and vitamin E. Clinical follow-up recommended to discuss any further work-up recommendations and appropriate treatment. RECOMMENDATION: Per protocol, patient may return to annual screening mammography in 1 year. However, given the history of breast cancer, the patient remains eligible for annual diagnostic mammography if preferred. (Code:SM-B-01Y) I have discussed the findings and recommendations with the patient. If applicable, a reminder letter will be sent to the patient regarding the next appointment. BI-RADS CATEGORY  1: Negative. Electronically Signed   By: Valentino Saxon M.D.   On: 10/22/2022 10:24   US BREAST LTD UNI LEFT INC AXILLA  Result Date:  10/22/2022 CLINICAL DATA:  Focal LEFT breast pain. Status post interval RIGHT mastectomy. EXAM: DIGITAL DIAGNOSTIC UNILATERAL LEFT MAMMOGRAM WITH TOMOSYNTHESIS; ULTRASOUND LEFT BREAST LIMITED TECHNIQUE: Left digital diagnostic mammography and breast tomosynthesis was performed.; Targeted ultrasound examination of the left breast was performed. COMPARISON:  Previous exam(s). ACR Breast Density Category b: There are scattered areas of fibroglandular density. FINDINGS: Spot compression tomosynthesis views were obtained over the area of focal pain in the LEFT breast. No suspicious mammographic finding is identified in this area. No suspicious mass, microcalcification, or other finding is identified in the LEFT breast. On physical exam, no suspicious mass is appreciated. Targeted LEFT breast ultrasound was performed in the area of pain at the lower breast. No suspicious solid or cystic mass is identified. No suspicious findings to explain the patient's symptoms. IMPRESSION: 1. No mammographic or sonographic evidence of malignancy at the site of painful concern in the LEFT breast. Any further workup or further cross-sectional imaging of the patient's symptoms should be based on the clinical assessment. Recommend annual mammogram in 1 year. 2. Breast pain is a common condition, which will often resolve on its own without intervention. It can be affected by hormonal changes, medication side effect, weight changes and fit of the bra. Pain may also be referred from other adjacent areas of the body. Breast pain may be improved by wearing adequate well-fitting support, over-the-counter topical and oral NSAID medication, low-fat diet, and ice/heat as needed. Studies have shown an improvement in cyclic pain with use of evening primrose oil, vitamin D and vitamin E. Clinical follow-up recommended to discuss any further work-up recommendations and appropriate treatment. RECOMMENDATION: Per protocol, patient may return to annual  screening mammography in 1 year. However, given the history of breast cancer, the patient remains eligible for annual diagnostic mammography if preferred. (Code:SM-B-01Y) I have discussed the findings and recommendations with the patient. If applicable, a reminder letter will be sent to the patient regarding the next appointment. BI-RADS CATEGORY  1: Negative. Electronically Signed   By: Valentino Saxon M.D.   On: 10/22/2022 10:24

## 2022-11-19 ENCOUNTER — Encounter: Payer: Self-pay | Admitting: *Deleted

## 2022-11-19 DIAGNOSIS — Z4431 Encounter for fitting and adjustment of external right breast prosthesis: Secondary | ICD-10-CM | POA: Diagnosis not present

## 2022-11-19 DIAGNOSIS — Z9011 Acquired absence of right breast and nipple: Secondary | ICD-10-CM | POA: Diagnosis not present

## 2022-11-19 DIAGNOSIS — C50011 Malignant neoplasm of nipple and areola, right female breast: Secondary | ICD-10-CM | POA: Diagnosis not present

## 2022-11-19 NOTE — Progress Notes (Signed)
Angela Deleon called asking when she can pick up her next gift card.  She can pick one up tomorrow.

## 2022-12-01 NOTE — Progress Notes (Signed)
[place] CSW Progress Note  Clinical Education officer, museum contacted patient by phone to assist with update on servant center referral.  CSW left voicemail with contact information and request for return call.    Adelene Amas, LCSW    Patient is participating in a Managed Medicaid Plan:  Yes

## 2022-12-16 ENCOUNTER — Other Ambulatory Visit: Payer: Self-pay | Admitting: Family Medicine

## 2022-12-16 DIAGNOSIS — Z8041 Family history of malignant neoplasm of ovary: Secondary | ICD-10-CM

## 2022-12-16 DIAGNOSIS — Z853 Personal history of malignant neoplasm of breast: Secondary | ICD-10-CM

## 2022-12-16 DIAGNOSIS — R112 Nausea with vomiting, unspecified: Secondary | ICD-10-CM

## 2022-12-25 ENCOUNTER — Other Ambulatory Visit: Payer: Self-pay

## 2022-12-25 ENCOUNTER — Inpatient Hospital Stay: Payer: Medicaid Other

## 2022-12-25 ENCOUNTER — Encounter: Payer: Self-pay | Admitting: Internal Medicine

## 2022-12-25 ENCOUNTER — Inpatient Hospital Stay: Payer: Medicaid Other | Attending: Internal Medicine | Admitting: Internal Medicine

## 2022-12-25 ENCOUNTER — Other Ambulatory Visit: Payer: Self-pay | Admitting: *Deleted

## 2022-12-25 VITALS — BP 159/74 | HR 66 | Temp 97.8°F | Resp 18 | Wt 195.8 lb

## 2022-12-25 DIAGNOSIS — Z800 Family history of malignant neoplasm of digestive organs: Secondary | ICD-10-CM | POA: Insufficient documentation

## 2022-12-25 DIAGNOSIS — D649 Anemia, unspecified: Secondary | ICD-10-CM

## 2022-12-25 DIAGNOSIS — Z170 Estrogen receptor positive status [ER+]: Secondary | ICD-10-CM | POA: Insufficient documentation

## 2022-12-25 DIAGNOSIS — F419 Anxiety disorder, unspecified: Secondary | ICD-10-CM | POA: Insufficient documentation

## 2022-12-25 DIAGNOSIS — Z8041 Family history of malignant neoplasm of ovary: Secondary | ICD-10-CM | POA: Insufficient documentation

## 2022-12-25 DIAGNOSIS — C50911 Malignant neoplasm of unspecified site of right female breast: Secondary | ICD-10-CM

## 2022-12-25 DIAGNOSIS — Z79811 Long term (current) use of aromatase inhibitors: Secondary | ICD-10-CM | POA: Diagnosis not present

## 2022-12-25 DIAGNOSIS — Z9011 Acquired absence of right breast and nipple: Secondary | ICD-10-CM | POA: Diagnosis not present

## 2022-12-25 DIAGNOSIS — F17210 Nicotine dependence, cigarettes, uncomplicated: Secondary | ICD-10-CM | POA: Insufficient documentation

## 2022-12-25 DIAGNOSIS — Z8049 Family history of malignant neoplasm of other genital organs: Secondary | ICD-10-CM | POA: Insufficient documentation

## 2022-12-25 DIAGNOSIS — C50411 Malignant neoplasm of upper-outer quadrant of right female breast: Secondary | ICD-10-CM | POA: Diagnosis present

## 2022-12-25 LAB — IRON AND TIBC
Iron: 22 ug/dL — ABNORMAL LOW (ref 28–170)
Saturation Ratios: 5 % — ABNORMAL LOW (ref 10.4–31.8)
TIBC: 437 ug/dL (ref 250–450)
UIBC: 415 ug/dL

## 2022-12-25 LAB — FOLATE: Folate: 16.5 ng/mL (ref >5.9)

## 2022-12-25 LAB — FERRITIN: Ferritin: 18 ng/mL (ref 11–307)

## 2022-12-25 LAB — VITAMIN B12: Vitamin B-12: 211 pg/mL (ref 180–914)

## 2022-12-25 MED ORDER — PANTOPRAZOLE SODIUM 40 MG PO TBEC: 40.0000 mg | DELAYED_RELEASE_TABLET | Freq: Two times a day (BID) | ORAL | 1 refills | Status: DC

## 2022-12-25 MED ORDER — ONDANSETRON HCL 4 MG PO TABS: 4.0000 mg | ORAL_TABLET | Freq: Three times a day (TID) | ORAL | 0 refills | Status: AC | PRN

## 2022-12-25 NOTE — Progress Notes (Signed)
[facility] CONSULT NOTE  Patient Care Team: Angela Barter, MD as PCP - General (Oncology) Angela Luster, RN as Oncology Nurse Navigator   CANCER STAGING   Cancer Staging  Breast cancer Staging form: Breast, AJCC 8th Edition - Pathologic stage from 08/27/2022: Stage IB (pT2, pN1a(sn), cM0, G2, ER+, PR+, HER2-, Oncotype DX score: 5) - Signed by Angela Barter, MD on 09/17/2022 Stage prefix: Initial diagnosis Method of lymph node assessment: Sentinel lymph node biopsy Multigene prognostic tests performed: Oncotype DX Recurrence score range: Less than 11 Histologic grading system: 3 grade system   ASSESSMENT & Deleon:  Angela Deleon 62 y.o. female with pmh of fibromyalgia, hypertension and depression was seen in medical oncology to discuss treatment for right-sided breast cancer.  # Right breast invasive ductal cancer, pathologic stage Ib (pT2N1) ER/PR+, HER2- -self palpable. S/p US guided biopsy on 08/13/2022.  -s/p right-sided mastectomy with SLNB by Dr. Maia Deleon on 08/27/2022.  Pathology showed 2.8 cm invasive mammary cancer upper outer quadrant, grade 2, 2 smaller foci in the lower outer quadrant measuring 5 mm and 2.5 mm grade 2, margins negative, 1/4 lymph node positive for macrometastatic disease 3.2 mm, ENE negative, no LVI, pathologic stage pT2 N1a   -Oncotype DX testing showed low recurrence score of 5.  So there is no benefit of chemotherapy.  Was seen by Dr. Rushie Deleon and no role for radiation.  -DEXA scan to obtain baseline bone health. No show to appt scheduled on 2/22.  DEXA scan rescheduled to April 17.  -Patient started letrozole about 6 weeks ago.  She has difficulty tolerating it.  At the end of 1 week of starting letrozole, she started experiencing nausea, vomiting 2-3 episodes per day, worsening fatigue, myalgias, arthralgias and worsened constipation from baseline.  Having occasional dizziness and left-sided headache.  Has hot flashes few times a week.   Evaluated by Dr. Timothy Deleon of GI.  Deleon for endoscopy and colonoscopy May 20.  Started on Protonix 40 mg twice daily but patient tells me she did not get the prescription from GI so I will send a prescription.  Take Metamucil or MiraLAX once or twice a day as needed.  Sent prescription for Zofran 4 mg 3 times daily as needed.  I advised her to stop letrozole.  I will reassess her symptoms in 3 weeks.  If her symptoms resolve then it is likely related to letrozole.  I did discuss with the patient and her husband that GI symptoms are very unusual with letrozole but can happen.  I will consider starting her on anastrozole next time.  # Anemia -New.  Hemoglobin of 10.9.  MCV 80.  Check iron panel, folate and B12.  B12 sample was hemolyzed so cannot be.  # Family history of colon, uterine and ovarian cancer -[facility] gene panel from 09/29/2022 negative.  # Anxiety -Patient follows with [facility].  She is on multiple medications-Klonopin, Lexapro, Wellbutrin, Seroquel.   Orders Placed This Encounter  Procedures   Iron and TIBC    Standing Status:   Future    Number of Occurrences:   1    Standing Expiration Date:   12/25/2023   Ferritin    Standing Status:   Future    Number of Occurrences:   1    Standing Expiration Date:   12/25/2023   Folate    Standing Status:   Future    Number of Occurrences:   1    Standing Expiration Date:   12/25/2023  RTC in 3 weeks for MD visit.  The total time spent in the appointment was 30 minutes encounter with patients including review of chart and various tests results, discussions about Deleon of care and coordination of care Deleon   All questions were answered. The patient knows to call the clinic with any problems, questions or concerns. No barriers to learning was detected.  Angela Barter, MD 4/12/202412:53 PM   HISTORY OF PRESENTING ILLNESS:  Angela Deleon 62 y.o. female with pmh of fibromyalgia, hypertension and depression was seen in medical  oncology to discuss treatment for right-sided breast cancer.  Interval history Patient was seen today accompanied by husband for follow-up.  Patient started letrozole about 6 weeks ago.  She has difficulty tolerating it.  At the end of 1 week of letrozole, she started experiencing nausea, vomiting 2-3 episodes per day, worsening fatigue, myalgias, arthralgias and worsened constipation from baseline.  Occasional dizziness and left-sided headache.  Appetite is affected by nausea and vomiting.  Has hot flashes few times a week.  Evaluated by Dr. Timothy Deleon of GI.  Deleon for endoscopy and colonoscopy May 20.   I have reviewed her chart and materials related to her cancer extensively and collaborated history with the patient. Summary of oncologic history is as follows: Oncology History  Breast cancer  07/15/2022 Initial Diagnosis   Patient noticed a palpable lump in the right upper outer quadrant of the breast.  Last mammogram was in 2007.   07/27/2022 Mammogram   Diagnostic mammogram and US  IMPRESSION: 1. Highly suspicious mass in the RIGHT breast at the 11 o'clock axis, 3 cm from the nipple, measuring 1.9 cm, corresponding to the patient's palpable area of concern. Ultrasound-guided biopsy is recommended. 2. Several additional hypoechoic areas adjacent to the dominant RIGHT breast mass, suspected satellite masses, most peripheral component located at the 10 o'clock axis, 5 cm from the nipple, measuring 1 cm. Ultrasound-guided biopsy is recommended this most peripheral hypoechoic area/satellite mass. 3. No evidence of malignancy within the LEFT breast.   08/13/2022 Pathology Results   DIAGNOSIS: A.  BREAST, RIGHT 10:00 5 CMFN (HEART); ULTRASOUND-GUIDED BIOPSY: - INVASIVE MAMMARY CARCINOMA, NO SPECIAL TYPE. Size of invasive carcinoma: 6 mm in this sample Histologic grade of invasive carcinoma: Grade 1                      Glandular/tubular differentiation score: 2                      Nuclear  pleomorphism score: 2                      Mitotic rate score: 1                      Total score: 5 Ductal carcinoma in situ: Not identified Lymphovascular invasion: Not identified ER/PR/HER2: Immunohistochemistry will be performed on block A1, with reflex to FISH for HER2 2+. The results will be reported in an addendum.  B.  BREAST, RIGHT 11:00 3 CMFN (VENUS); ULTRASOUND-GUIDED BIOPSY: - INVASIVE MAMMARY CARCINOMA, NO SPECIAL TYPE, INVOLVING ORGANIZING FAT NECROSIS. Size of invasive carcinoma: 12 mm in this sample Histologic grade of invasive carcinoma: Grade 2                      Glandular/tubular differentiation score: 3  Nuclear pleomorphism score: 2                      Mitotic rate score: 1                      Total score: 6 Ductal carcinoma in situ: Not identified Lymphovascular invasion: Not identified   ADDENDUM:  CASE SUMMARY: BREAST BIOMARKER TESTS - PART A: RIGHT 10:00 5 CMFN  (HEART)  Estrogen Receptor (ER) Status: POSITIVE          Percentage of cells with nuclear positivity: Greater than 90%          Average intensity of staining: Strong   Progesterone Receptor (PgR) Status: POSITIVE          Percentage of cells with nuclear positivity: 51-90%          Average intensity of staining: Moderate   HER2 (by immunohistochemistry): NEGATIVE (Score 0)   Ki-67: Not performed   CASE SUMMARY: BREAST BIOMARKER TESTS - PART B: RIGHT 11:00 3 CMFN (VENUS) Estrogen Receptor (ER) Status: POSITIVE         Percentage of cells with nuclear positivity: Greater than 90%         Average intensity of staining: Strong  Progesterone Receptor (PgR) Status: POSITIVE         Percentage of cells with nuclear positivity: 11-50%         Average intensity of staining: Moderate  HER2 (by immunohistochemistry): NEGATIVE (Score 0)    08/27/2022 Definitive Surgery   Right mastectomy with SLNB by Dr. Maia Deleon  Pathology showed 2.8 cm invasive mammary cancer upper  outer quadrant, grade 2, 2 smaller foci in the lower outer quadrant measuring 5 mm and 2.5 mm grade 2, margins negative, 1/4 lymph node positive for macrometastatic disease 3.2 mm, ENE negative, no LVI, pathologic stage pT2 N1a     08/27/2022 Oncotype testing   Oncotype DX recurrence score 5.  No benefit from chemotherapy.    Genetic Testing   Negative genetic testing. No pathogenic variants identified on the [facility] Common Hereditary Cancers+RNA panel. The report date is 09/27/2022.  The Common Hereditary Cancers Panel + RNA offered by [facility] includes sequencing and/or deletion duplication testing of the following 48 genes: APC*, ATM*, AXIN2, BAP1, BARD1, BMPR1A, BRCA1, BRCA2, BRIP1, CDH1, CDK4, CDKN2A (p14ARF), CDKN2A (p16INK4a), CHEK2, CTNNA1, DICER1*, EPCAM*, FH*, GREM1*, HOXB13, KIT, MBD4, MEN1*, MLH1*, MSH2*, MSH3*, MSH6*, MUTYH, NF1*, NTHL1, PALB2, PDGFRA, PMS2*, POLD1*, POLE, PTEN*, RAD51C, RAD51D, SDHA*, SDHB, SDHC*, SDHD, SMAD4, SMARCA4, STK11, TP53, TSC1*, TSC2, VHL.     Menarche age 82 Age at first birth 82 Birth control yes in 16s Menopause age 74 HRT no  MEDICAL HISTORY:  Past Medical History:  Diagnosis Date   Acid reflux    Anxiety    Asthma    Breast cancer 08/2022   right   Depression    Fibromyalgia    Hypertension, essential     SURGICAL HISTORY: Past Surgical History:  Procedure Laterality Date   BREAST BIOPSY Right    2006? neg   BREAST BIOPSY Right 08/13/2022   rt br u/s bx 11:00  venus clip path pending   BREAST BIOPSY Right 08/13/2022   [place] RT BREAST BX W LOC DEV 1ST LESION IMG BX SPEC US GUIDE 08/13/2022 [facility]   BREAST BIOPSY Right 08/13/2022   [place] RT BREAST BX W LOC DEV EA ADD LESION IMG BX SPEC US GUIDE 08/13/2022 [facility]   COLONOSCOPY  CYST EXCISION Right    arm   DIRECT LARYNGOSCOPY  11/14/2013   MASTECTOMY W/ SENTINEL NODE BIOPSY Right 08/27/2022   Procedure: MASTECTOMY WITH SENTINEL LYMPH NODE BIOPSY;  Surgeon:  Carolan Shiver, MD;  Location: [facility];  Service: General;  Laterality: Right;   TUBAL LIGATION  1992    SOCIAL HISTORY: Social History   Socioeconomic History   Marital status: Single    Spouse name: Not on file   Number of children: Not on file   Years of education: Not on file   Highest education level: Not on file  Occupational History   Not on file  Tobacco Use   Smoking status: Every Day    Packs/day: .5    Types: Cigarettes   Smokeless tobacco: Never  Vaping Use   Vaping Use: Never used  Substance and Sexual Activity   Alcohol use: Not Currently    Comment: social drinker   Drug use: Yes    Types: Marijuana   Sexual activity: Yes    Birth control/protection: Post-menopausal  Other Topics Concern   Not on file  Social History Narrative   Lives with fiance in a camper   Social Determinants of Health   Financial Resource Strain: High Risk (08/19/2022)   Overall Financial Resource Strain (CARDIA)    Difficulty of Paying Living Expenses: Hard  Food Insecurity: No Food Insecurity (08/28/2022)   Hunger Vital Sign    Worried About Running Out of Food in the Last Year: Never true    Ran Out of Food in the Last Year: Never true  Recent Concern: Food Insecurity - Food Insecurity Present (08/19/2022)   Hunger Vital Sign    Worried About Running Out of Food in the Last Year: Sometimes true    Ran Out of Food in the Last Year: Sometimes true  Transportation Needs: Unmet Transportation Needs (08/28/2022)   PRAPARE - Transportation    Lack of Transportation (Medical): Yes    Lack of Transportation (Non-Medical): Yes  Physical Activity: Inactive (08/19/2022)   Exercise Vital Sign    Days of Exercise per Week: 0 days    Minutes of Exercise per Session: 0 min  Stress: Stress Concern Present (08/19/2022)   [employer] of Occupational Health - Occupational Stress Questionnaire    Feeling of Stress : To some extent  Social Connections: Moderately Isolated  (08/19/2022)   Social Connection and Isolation Panel [NHANES]    Frequency of Communication with Friends and Family: Three times a week    Frequency of Social Gatherings with Friends and Family: Once a week    Attends Religious Services: Never    Database administrator or Organizations: No    Attends Banker Meetings: Never    Marital Status: Living with partner  Intimate Partner Violence: Not At Risk (08/28/2022)   Humiliation, Afraid, Rape, and Kick questionnaire    Fear of Current or Ex-Partner: No    Emotionally Abused: No    Physically Abused: No    Sexually Abused: No    FAMILY HISTORY: Family History  Problem Relation Age of Onset   Colon cancer Mother 98   Ovarian cancer Mother 23   Cancer Brother        unk type   Ovarian cancer Maternal Grandmother    Esophageal cancer Half-Brother     ALLERGIES:  is allergic to iodinated contrast media and penicillins.  MEDICATIONS:  Current Outpatient Medications  Medication Sig Dispense Refill   albuterol (VENTOLIN HFA) 108 (  90 Base) MCG/ACT inhaler Inhale 2 puffs into the lungs every 6 (six) hours as needed.     atorvastatin (LIPITOR) 20 MG tablet Take 20 mg by mouth daily.     buPROPion (WELLBUTRIN XL) 150 MG 24 hr tablet Take 150 mg by mouth every morning.     clonazePAM (KLONOPIN) 1 MG tablet Take 1 mg by mouth 2 (two) times daily as needed for anxiety.     Docusate Sodium (DSS) 100 MG CAPS Take 1 capsule by mouth 2 (two) times daily.     doxycycline (VIBRA-TABS) 100 MG tablet Take 100 mg by mouth 2 (two) times daily.     famotidine (PEPCID) 20 MG tablet Take 20 mg by mouth daily.     gabapentin (NEURONTIN) 400 MG capsule Take 400 mg by mouth 3 (three) times daily.     letrozole (FEMARA) 2.5 MG tablet Take 1 tablet (2.5 mg total) by mouth daily. 90 tablet 2   lisinopril-hydrochlorothiazide (ZESTORETIC) 20-12.5 MG tablet Take 1 tablet by mouth daily. 30 tablet 1   methocarbamol (ROBAXIN) 500 MG tablet Take 1  tablet (500 mg total) by mouth 4 (four) times daily. 15 tablet 0   pantoprazole (PROTONIX) 40 MG tablet Take 1 tablet (40 mg total) by mouth 2 (two) times daily. 60 tablet 1   QUEtiapine (SEROQUEL) 50 MG tablet Take 50 mg by mouth at bedtime.     sertraline (ZOLOFT) 100 MG tablet Take 150 mg by mouth daily.     escitalopram (LEXAPRO) 20 MG tablet Take 20 mg by mouth daily. (Patient not taking: Reported on 08/27/2022)     ibuprofen (ADVIL) 200 MG tablet Take 600 mg by mouth daily as needed. (Patient not taking: Reported on 12/25/2022)     ondansetron (ZOFRAN) 4 MG tablet Take 1 tablet (4 mg total) by mouth every 8 (eight) hours as needed for up to 14 days for nausea or vomiting. 30 tablet 0   oxyCODONE-acetaminophen (PERCOCET) 5-325 MG tablet Take 1 tablet by mouth every 4 (four) hours as needed for severe pain. (Patient not taking: Reported on 12/25/2022) 20 tablet 0   SUMAtriptan (IMITREX) 50 MG tablet Take 1 tablet (50 mg total) by mouth once for 1 dose. May repeat in 2 hours if headache persists or recurs. 10 tablet 0   No current facility-administered medications for this visit.    REVIEW OF SYSTEMS:   Pertinent information mentioned in HPI All other systems were reviewed with the patient and are negative.  PHYSICAL EXAMINATION: ECOG PERFORMANCE STATUS: 0 - Asymptomatic  Vitals:   12/25/22 1032  BP: (!) 159/74  Pulse: 66  Resp: 18  Temp: 97.8 F (36.6 C)  SpO2: 97%     Filed Weights   12/25/22 1032  Weight: 195 lb 12.8 oz (88.8 kg)      GENERAL:alert, no distress and comfortable SKIN: skin color, texture, turgor are normal, no rashes or significant lesions EYES: normal, conjunctiva are pink and non-injected, sclera clear OROPHARYNX:no exudate, no erythema and lips, buccal mucosa, and tongue normal  NECK: supple, thyroid normal size, non-tender, without nodularity LYMPH:  no palpable lymphadenopathy in the cervical, axillary or inguinal LUNGS: clear to auscultation and  percussion with normal breathing effort HEART: regular rate & rhythm and no murmurs and no lower extremity edema ABDOMEN:abdomen soft, non-tender and normal bowel sounds Musculoskeletal:no cyanosis of digits and no clubbing  PSYCH: alert & oriented x 3 with fluent speech NEURO: no focal motor/sensory deficits  Right breat-suture site healing well.  On the medial aspect slightly open but no signs of infection noted.  Bruising in the right axilla. Left breast, small movable pea-sized palpable tender area in the left breast in the lower outer quadrant.   LABORATORY DATA:  I have reviewed the data as listed Lab Results  Component Value Date   WBC 15.6 (H) 08/28/2022   HGB 12.3 08/28/2022   HCT 39.3 08/28/2022   MCV 88.1 08/28/2022   PLT 208 08/28/2022   Recent Labs    01/21/22 1301 08/26/22 1111 08/28/22 0644  NA 135 138 137  K 3.9 4.1 4.2  CL 104 106 106  CO2 24 28 24   GLUCOSE 98 92 118*  BUN 14 18 19   CREATININE 0.85 0.74 0.78  CALCIUM 8.8* 8.8* 8.6*  GFRNONAA >60 >60 >60    RADIOGRAPHIC STUDIES: I have personally reviewed the radiological images as listed and agreed with the findings in the report. No results found.

## 2022-12-25 NOTE — Patient Instructions (Signed)
STOP LETROZOLE  Take Metamucil or Miralax over the counter 1-2 times a day as needed for constipation.

## 2022-12-28 ENCOUNTER — Ambulatory Visit
Admission: RE | Admit: 2022-12-28 | Discharge: 2022-12-28 | Disposition: A | Discharge status: Home / Self Care | Payer: Medicaid Other | Source: Ambulatory Visit | Attending: Family Medicine | Admitting: Family Medicine

## 2022-12-28 DIAGNOSIS — R112 Nausea with vomiting, unspecified: Secondary | ICD-10-CM | POA: Insufficient documentation

## 2022-12-28 DIAGNOSIS — Z8041 Family history of malignant neoplasm of ovary: Secondary | ICD-10-CM | POA: Diagnosis not present

## 2022-12-28 DIAGNOSIS — Z853 Personal history of malignant neoplasm of breast: Secondary | ICD-10-CM | POA: Diagnosis not present

## 2022-12-30 ENCOUNTER — Ambulatory Visit
Admission: RE | Admit: 2022-12-30 | Discharge: 2022-12-30 | Disposition: A | Discharge status: Home / Self Care | Payer: Medicaid Other | Source: Ambulatory Visit | Attending: Internal Medicine | Admitting: Internal Medicine

## 2022-12-30 DIAGNOSIS — Z170 Estrogen receptor positive status [ER+]: Secondary | ICD-10-CM | POA: Insufficient documentation

## 2022-12-30 DIAGNOSIS — C50911 Malignant neoplasm of unspecified site of right female breast: Secondary | ICD-10-CM | POA: Insufficient documentation

## 2023-01-15 ENCOUNTER — Inpatient Hospital Stay: Payer: BLUE CROSS/BLUE SHIELD | Attending: Internal Medicine | Admitting: Internal Medicine

## 2023-01-15 ENCOUNTER — Encounter: Payer: Self-pay | Admitting: Internal Medicine

## 2023-01-15 VITALS — BP 133/80 | HR 70 | Temp 98.7°F | Resp 16 | Ht 61.0 in | Wt 194.0 lb

## 2023-01-15 DIAGNOSIS — C50911 Malignant neoplasm of unspecified site of right female breast: Secondary | ICD-10-CM | POA: Diagnosis not present

## 2023-01-15 DIAGNOSIS — M79601 Pain in right arm: Secondary | ICD-10-CM | POA: Diagnosis not present

## 2023-01-15 DIAGNOSIS — Z800 Family history of malignant neoplasm of digestive organs: Secondary | ICD-10-CM | POA: Insufficient documentation

## 2023-01-15 DIAGNOSIS — N644 Mastodynia: Secondary | ICD-10-CM | POA: Insufficient documentation

## 2023-01-15 DIAGNOSIS — F419 Anxiety disorder, unspecified: Secondary | ICD-10-CM | POA: Insufficient documentation

## 2023-01-15 DIAGNOSIS — F17210 Nicotine dependence, cigarettes, uncomplicated: Secondary | ICD-10-CM | POA: Insufficient documentation

## 2023-01-15 DIAGNOSIS — Z9011 Acquired absence of right breast and nipple: Secondary | ICD-10-CM | POA: Diagnosis not present

## 2023-01-15 DIAGNOSIS — C50411 Malignant neoplasm of upper-outer quadrant of right female breast: Secondary | ICD-10-CM | POA: Diagnosis present

## 2023-01-15 DIAGNOSIS — D508 Other iron deficiency anemias: Secondary | ICD-10-CM

## 2023-01-15 DIAGNOSIS — D509 Iron deficiency anemia, unspecified: Secondary | ICD-10-CM | POA: Diagnosis not present

## 2023-01-15 DIAGNOSIS — Z79811 Long term (current) use of aromatase inhibitors: Secondary | ICD-10-CM

## 2023-01-15 DIAGNOSIS — Z170 Estrogen receptor positive status [ER+]: Secondary | ICD-10-CM | POA: Insufficient documentation

## 2023-01-15 DIAGNOSIS — Z8049 Family history of malignant neoplasm of other genital organs: Secondary | ICD-10-CM | POA: Diagnosis not present

## 2023-01-15 DIAGNOSIS — Z8041 Family history of malignant neoplasm of ovary: Secondary | ICD-10-CM | POA: Insufficient documentation

## 2023-01-15 MED ORDER — EXEMESTANE 25 MG PO TABS: 25.0000 mg | ORAL_TABLET | Freq: Every day | ORAL | 2 refills | Status: DC

## 2023-01-15 NOTE — Progress Notes (Signed)
Patient complains of having numbness and nerve pain under right axillary area. Also gets a pain that radiates from right side of back to right breast where incision was done. Describes it as a shooting pain. She is taking gabapentin and it does not help with pain. Has a knot on left breast that she is concerned about. Stopped letrazole for the past 3 weeks and has a better appetite and not as much nausea.

## 2023-01-15 NOTE — Progress Notes (Signed)
Survivorship Care Plan visit completed.  Treatment summary reviewed and given to patient.  ASCO answers booklet reviewed and given to patient.  CARE program and Cancer Transitions discussed with patient along with other resources cancer center offers to patients and caregivers.  Patient verbalized understanding.    

## 2023-01-15 NOTE — Progress Notes (Signed)
[facility] CONSULT NOTE  Patient Care Team: Michaelyn Barter, MD as PCP - General (Oncology) Hulen Luster, RN as Oncology Nurse Navigator Carolan Shiver, MD as Consulting Physician (General Surgery)   CANCER STAGING   Cancer Staging  Breast cancer [facility]) Staging form: Breast, AJCC 8th Edition - Pathologic stage from 08/27/2022: Stage IB (pT2, pN1a(sn), cM0, G2, ER+, PR+, HER2-, Oncotype DX score: 5) - Signed by Michaelyn Barter, MD on 09/17/2022 Stage prefix: Initial diagnosis Method of lymph node assessment: Sentinel lymph node biopsy Multigene prognostic tests performed: Oncotype DX Recurrence score range: Less than 11 Histologic grading system: 3 grade system   ASSESSMENT & PLAN:  Angela Deleon 62 y.o. female with pmh of fibromyalgia, hypertension and depression was seen in medical oncology to discuss treatment for right-sided breast cancer.  # Right breast invasive ductal cancer, pathologic stage Ib (pT2N1) ER/PR+, HER2- -self palpable. S/p US guided biopsy on 08/13/2022.  -s/p right-sided mastectomy with SLNB by Dr. Maia Plan on 08/27/2022.  Pathology showed 2.8 cm invasive mammary cancer upper outer quadrant, grade 2, 2 smaller foci in the lower outer quadrant measuring 5 mm and 2.5 mm grade 2, margins negative, 1/4 lymph node positive for macrometastatic disease 3.2 mm, ENE negative, no LVI, pathologic stage pT2 N1a   -Oncotype DX testing showed low recurrence score of 5.  So there is no benefit of chemotherapy.  Was seen by Dr. Rushie Chestnut and no role for radiation.  -DEXA scan from 12/30/2022 showed normal density in the spine, osteopenia in hip.  Continue with calcium vitamin D supplements.  -Patient started letrozole on 11/13/2022.  Discontinued on 12/25/2022 due to GI symptoms such as nausea, vomiting, worsening fatigue, myalgias, arthralgias and worsened constipation from baseline.  Seen by Dr. Timothy Lasso and scheduled for endoscopy and colonoscopy be on May 20.  Was  also found to have iron deficiency anemia.  Seen today as 3 weeks follow-up for her symptoms.  Her symptoms have resolved since discontinuation of letrozole.  We discussed about alternatives.  Will proceed with Aromasin 25 mg daily.  Prescription sent.  I will follow-up with her in 5 weeks.  Patient was advised that if she starts having symptoms she should hold the medication and inform me.  # Left breast pain -Reports focal pain with knot-like sensation. Had mammogram on 10/22/2022 which did not show any concerning masses.  Discussed about supportive care with trial of topical NSAID and primrose oral.  Advised to start 1 thing at a time.  # Right arm pain -Present since surgery.  Neuropathic -Is on gabapentin 400 mg 3 times daily for fibromyalgia but not helping with the pain.  Has tried Lyrica in the past.  Discussed about considering Cymbalta but now that we are trying Aromasin will make one medication change at a time.  # Iron deficiency anemia -Hemoglobin 10.5.  Ferritin 18 and saturation 5%.  Continue with iron supplements.  Repeat iron panel and CBC in 3 months.  She does not respond to oral iron we will consider IV iron infusions. -Scheduled for colonoscopy and endoscopy with Dr. Timothy Lasso on May 20  # Family history of colon, uterine and ovarian cancer -[facility] gene panel from 09/29/2022 negative.  # Anxiety -Patient follows with [facility].  She is on multiple medications-Klonopin, Lexapro, Wellbutrin, Seroquel.   Orders Placed This Encounter  Procedures   CBC with Differential/Platelet    Standing Status:   Future    Standing Expiration Date:   01/15/2024   Comprehensive metabolic  panel    Standing Status:   Future    Standing Expiration Date:   01/15/2024   RTC in 5 weeks for MD visit, labs for toxicity check of Aromasin  The total time spent in the appointment was 30 minutes encounter with patients including review of chart and various tests results, discussions about plan  of care and coordination of care plan   All questions were answered. The patient knows to call the clinic with any problems, questions or concerns. No barriers to learning was detected.  Michaelyn Barter, MD 5/3/20242:34 PM   HISTORY OF PRESENTING ILLNESS:  Angela Deleon 62 y.o. female with pmh of fibromyalgia, hypertension and depression was seen in medical oncology to discuss treatment for right-sided breast cancer.  Interval history Patient was seen today accompanied by husband for follow-up.  Patient stopped letrozole 3 weeks ago.  Her GI symptoms have resolved.  Has been eating better.  Taking iron supplements.  She continues to have discomfort in her left breast where she feels a knot.  It is painful exacerbated by certain activities she does.  Also having some pain in the left groin.  Had ultrasound pelvis with transvaginal which was normal.  I have reviewed her chart and materials related to her cancer extensively and collaborated history with the patient. Summary of oncologic history is as follows: Oncology History  Breast cancer (HCC)  07/15/2022 Initial Diagnosis   Patient noticed a palpable lump in the right upper outer quadrant of the breast.  Last mammogram was in 2007.   07/27/2022 Mammogram   Diagnostic mammogram and US  IMPRESSION: 1. Highly suspicious mass in the RIGHT breast at the 11 o'clock axis, 3 cm from the nipple, measuring 1.9 cm, corresponding to the patient's palpable area of concern. Ultrasound-guided biopsy is recommended. 2. Several additional hypoechoic areas adjacent to the dominant RIGHT breast mass, suspected satellite masses, most peripheral component located at the 10 o'clock axis, 5 cm from the nipple, measuring 1 cm. Ultrasound-guided biopsy is recommended this most peripheral hypoechoic area/satellite mass. 3. No evidence of malignancy within the LEFT breast.   08/13/2022 Pathology Results   DIAGNOSIS: A.  BREAST, RIGHT 10:00 5 CMFN (HEART);  ULTRASOUND-GUIDED BIOPSY: - INVASIVE MAMMARY CARCINOMA, NO SPECIAL TYPE. Size of invasive carcinoma: 6 mm in this sample Histologic grade of invasive carcinoma: Grade 1                      Glandular/tubular differentiation score: 2                      Nuclear pleomorphism score: 2                      Mitotic rate score: 1                      Total score: 5 Ductal carcinoma in situ: Not identified Lymphovascular invasion: Not identified ER/PR/HER2: Immunohistochemistry will be performed on block A1, with reflex to FISH for HER2 2+. The results will be reported in an addendum.  B.  BREAST, RIGHT 11:00 3 CMFN (VENUS); ULTRASOUND-GUIDED BIOPSY: - INVASIVE MAMMARY CARCINOMA, NO SPECIAL TYPE, INVOLVING ORGANIZING FAT NECROSIS. Size of invasive carcinoma: 12 mm in this sample Histologic grade of invasive carcinoma: Grade 2                      Glandular/tubular differentiation score: 3  Nuclear pleomorphism score: 2                      Mitotic rate score: 1                      Total score: 6 Ductal carcinoma in situ: Not identified Lymphovascular invasion: Not identified   ADDENDUM:  CASE SUMMARY: BREAST BIOMARKER TESTS - PART A: RIGHT 10:00 5 CMFN  (HEART)  Estrogen Receptor (ER) Status: POSITIVE          Percentage of cells with nuclear positivity: Greater than 90%          Average intensity of staining: Strong   Progesterone Receptor (PgR) Status: POSITIVE          Percentage of cells with nuclear positivity: 51-90%          Average intensity of staining: Moderate   HER2 (by immunohistochemistry): NEGATIVE (Score 0)   Ki-67: Not performed   CASE SUMMARY: BREAST BIOMARKER TESTS - PART B: RIGHT 11:00 3 CMFN (VENUS) Estrogen Receptor (ER) Status: POSITIVE         Percentage of cells with nuclear positivity: Greater than 90%         Average intensity of staining: Strong  Progesterone Receptor (PgR) Status: POSITIVE         Percentage of cells with  nuclear positivity: 11-50%         Average intensity of staining: Moderate  HER2 (by immunohistochemistry): NEGATIVE (Score 0)    08/27/2022 Definitive Surgery   Right mastectomy with SLNB by Dr. Maia Plan  Pathology showed 2.8 cm invasive mammary cancer upper outer quadrant, grade 2, 2 smaller foci in the lower outer quadrant measuring 5 mm and 2.5 mm grade 2, margins negative, 1/4 lymph node positive for macrometastatic disease 3.2 mm, ENE negative, no LVI, pathologic stage pT2 N1a     08/27/2022 Oncotype testing   Oncotype DX recurrence score 5.  No benefit from chemotherapy.    Genetic Testing   Negative genetic testing. No pathogenic variants identified on the [facility] Common Hereditary Cancers+RNA panel. The report date is 09/27/2022.  The Common Hereditary Cancers Panel + RNA offered by [facility] includes sequencing and/or deletion duplication testing of the following 48 genes: APC*, ATM*, AXIN2, BAP1, BARD1, BMPR1A, BRCA1, BRCA2, BRIP1, CDH1, CDK4, CDKN2A (p14ARF), CDKN2A (p16INK4a), CHEK2, CTNNA1, DICER1*, EPCAM*, FH*, GREM1*, HOXB13, KIT, MBD4, MEN1*, MLH1*, MSH2*, MSH3*, MSH6*, MUTYH, NF1*, NTHL1, PALB2, PDGFRA, PMS2*, POLD1*, POLE, PTEN*, RAD51C, RAD51D, SDHA*, SDHB, SDHC*, SDHD, SMAD4, SMARCA4, STK11, TP53, TSC1*, TSC2, VHL.     Menarche age 45 Age at first birth 12 Birth control yes in 3s Menopause age 35 HRT no  MEDICAL HISTORY:  Past Medical History:  Diagnosis Date   Acid reflux    Anxiety    Asthma    Breast cancer (HCC) 08/2022   right   Depression    Fibromyalgia    Hypertension, essential     SURGICAL HISTORY: Past Surgical History:  Procedure Laterality Date   BREAST BIOPSY Right    2006? neg   BREAST BIOPSY Right 08/13/2022   rt br u/s bx 11:00  venus clip path pending   BREAST BIOPSY Right 08/13/2022   [place] RT BREAST BX W LOC DEV 1ST LESION IMG BX SPEC US GUIDE 08/13/2022 [facility]   BREAST BIOPSY Right 08/13/2022   [place] RT BREAST BX W LOC DEV  EA ADD LESION IMG BX SPEC US GUIDE 08/13/2022 [facility]  COLONOSCOPY     CYST EXCISION Right    arm   DIRECT LARYNGOSCOPY  11/14/2013   MASTECTOMY W/ SENTINEL NODE BIOPSY Right 08/27/2022   Procedure: MASTECTOMY WITH SENTINEL LYMPH NODE BIOPSY;  Surgeon: Carolan Shiver, MD;  Location: [facility];  Service: General;  Laterality: Right;   TUBAL LIGATION  1992    SOCIAL HISTORY: Social History   Socioeconomic History   Marital status: Single    Spouse name: Not on file   Number of children: Not on file   Years of education: Not on file   Highest education level: Not on file  Occupational History   Not on file  Tobacco Use   Smoking status: Every Day    Packs/day: .5    Types: Cigarettes   Smokeless tobacco: Never  Vaping Use   Vaping Use: Never used  Substance and Sexual Activity   Alcohol use: Not Currently    Comment: social drinker   Drug use: Yes    Types: Marijuana   Sexual activity: Yes    Birth control/protection: Post-menopausal  Other Topics Concern   Not on file  Social History Narrative   Lives with fiance in a camper   Social Determinants of Health   Financial Resource Strain: High Risk (08/19/2022)   Overall Financial Resource Strain (CARDIA)    Difficulty of Paying Living Expenses: Hard  Food Insecurity: No Food Insecurity (08/28/2022)   Hunger Vital Sign    Worried About Running Out of Food in the Last Year: Never true    Ran Out of Food in the Last Year: Never true  Recent Concern: Food Insecurity - Food Insecurity Present (08/19/2022)   Hunger Vital Sign    Worried About Running Out of Food in the Last Year: Sometimes true    Ran Out of Food in the Last Year: Sometimes true  Transportation Needs: Unmet Transportation Needs (08/28/2022)   PRAPARE - Transportation    Lack of Transportation (Medical): Yes    Lack of Transportation (Non-Medical): Yes  Physical Activity: Inactive (08/19/2022)   Exercise Vital Sign    Days of Exercise per  Week: 0 days    Minutes of Exercise per Session: 0 min  Stress: Stress Concern Present (08/19/2022)   [employer] of Occupational Health - Occupational Stress Questionnaire    Feeling of Stress : To some extent  Social Connections: Moderately Isolated (08/19/2022)   Social Connection and Isolation Panel [NHANES]    Frequency of Communication with Friends and Family: Three times a week    Frequency of Social Gatherings with Friends and Family: Once a week    Attends Religious Services: Never    Database administrator or Organizations: No    Attends Banker Meetings: Never    Marital Status: Living with partner  Intimate Partner Violence: Not At Risk (08/28/2022)   Humiliation, Afraid, Rape, and Kick questionnaire    Fear of Current or Ex-Partner: No    Emotionally Abused: No    Physically Abused: No    Sexually Abused: No    FAMILY HISTORY: Family History  Problem Relation Age of Onset   Colon cancer Mother 51   Ovarian cancer Mother 66   Cancer Brother        unk type   Ovarian cancer Maternal Grandmother    Esophageal cancer Half-Brother     ALLERGIES:  is allergic to iodinated contrast media and penicillins.  MEDICATIONS:  Current Outpatient Medications  Medication Sig Dispense Refill  albuterol (VENTOLIN HFA) 108 (90 Base) MCG/ACT inhaler Inhale 2 puffs into the lungs every 6 (six) hours as needed.     atorvastatin (LIPITOR) 20 MG tablet Take 20 mg by mouth daily.     buPROPion (WELLBUTRIN XL) 150 MG 24 hr tablet Take 150 mg by mouth every morning.     clonazePAM (KLONOPIN) 1 MG tablet Take 1 mg by mouth 2 (two) times daily as needed for anxiety.     Docusate Sodium (DSS) 100 MG CAPS Take 1 capsule by mouth 2 (two) times daily.     exemestane (AROMASIN) 25 MG tablet Take 1 tablet (25 mg total) by mouth daily after breakfast. 30 tablet 2   famotidine (PEPCID) 20 MG tablet Take 20 mg by mouth daily.     gabapentin (NEURONTIN) 400 MG capsule Take 400 mg  by mouth 3 (three) times daily.     lisinopril-hydrochlorothiazide (ZESTORETIC) 20-12.5 MG tablet Take 1 tablet by mouth daily. 30 tablet 1   pantoprazole (PROTONIX) 40 MG tablet Take 1 tablet (40 mg total) by mouth 2 (two) times daily. 60 tablet 1   QUEtiapine (SEROQUEL) 50 MG tablet Take 50 mg by mouth at bedtime.     sertraline (ZOLOFT) 100 MG tablet Take 150 mg by mouth daily.     doxycycline (VIBRA-TABS) 100 MG tablet Take 100 mg by mouth 2 (two) times daily. (Patient not taking: Reported on 01/15/2023)     escitalopram (LEXAPRO) 20 MG tablet Take 20 mg by mouth daily. (Patient not taking: Reported on 08/27/2022)     ibuprofen (ADVIL) 200 MG tablet Take 600 mg by mouth daily as needed. (Patient not taking: Reported on 12/25/2022)     letrozole (FEMARA) 2.5 MG tablet Take 1 tablet (2.5 mg total) by mouth daily. (Patient not taking: Reported on 01/15/2023) 90 tablet 2   methocarbamol (ROBAXIN) 500 MG tablet Take 1 tablet (500 mg total) by mouth 4 (four) times daily. (Patient not taking: Reported on 01/15/2023) 15 tablet 0   oxyCODONE-acetaminophen (PERCOCET) 5-325 MG tablet Take 1 tablet by mouth every 4 (four) hours as needed for severe pain. (Patient not taking: Reported on 12/25/2022) 20 tablet 0   SUMAtriptan (IMITREX) 50 MG tablet Take 1 tablet (50 mg total) by mouth once for 1 dose. May repeat in 2 hours if headache persists or recurs. (Patient not taking: Reported on 01/15/2023) 10 tablet 0   No current facility-administered medications for this visit.    REVIEW OF SYSTEMS:   Pertinent information mentioned in HPI All other systems were reviewed with the patient and are negative.  PHYSICAL EXAMINATION: ECOG PERFORMANCE STATUS: 0 - Asymptomatic  Vitals:   01/15/23 1309  BP: 133/80  Pulse: 70  Resp: 16  Temp: 98.7 F (37.1 C)  SpO2: 96%      Filed Weights   01/15/23 1309  Weight: 194 lb (88 kg)       GENERAL:alert, no distress and comfortable SKIN: skin color, texture,  turgor are normal, no rashes or significant lesions EYES: normal, conjunctiva are pink and non-injected, sclera clear OROPHARYNX:no exudate, no erythema and lips, buccal mucosa, and tongue normal  NECK: supple, thyroid normal size, non-tender, without nodularity LYMPH:  no palpable lymphadenopathy in the cervical, axillary or inguinal LUNGS: clear to auscultation and percussion with normal breathing effort HEART: regular rate & rhythm and no murmurs and no lower extremity edema ABDOMEN:abdomen soft, non-tender and normal bowel sounds Musculoskeletal:no cyanosis of digits and no clubbing  PSYCH: alert & oriented x  3 with fluent speech NEURO: no focal motor/sensory deficits  Right breat-suture site healing well.  On the medial aspect slightly open but no signs of infection noted.  Bruising in the right axilla. Left breast, small movable pea-sized palpable tender area in the left breast in the lower outer quadrant.   LABORATORY DATA:  I have reviewed the data as listed Lab Results  Component Value Date   WBC 15.6 (H) 08/28/2022   HGB 12.3 08/28/2022   HCT 39.3 08/28/2022   MCV 88.1 08/28/2022   PLT 208 08/28/2022   Recent Labs    01/21/22 1301 08/26/22 1111 08/28/22 0644  NA 135 138 137  K 3.9 4.1 4.2  CL 104 106 106  CO2 24 28 24   GLUCOSE 98 92 118*  BUN 14 18 19   CREATININE 0.85 0.74 0.78  CALCIUM 8.8* 8.8* 8.6*  GFRNONAA >60 >60 >60    RADIOGRAPHIC STUDIES: I have personally reviewed the radiological images as listed and agreed with the findings in the report. US PELVIC COMPLETE WITH TRANSVAGINAL  Result Date: 12/30/2022 CLINICAL DATA:  Nausea. Family history of ovarian cancer in the patient's mother. Patient has a personal history of breast cancer. EXAM: TRANSABDOMINAL AND TRANSVAGINAL ULTRASOUND OF PELVIS TECHNIQUE: Both transabdominal and transvaginal ultrasound examinations of the pelvis were performed. Transabdominal technique was performed for global imaging of the  pelvis including uterus, ovaries, adnexal regions, and pelvic cul-de-sac. It was necessary to proceed with endovaginal exam following the transabdominal exam to visualize the uterus and endometrium. COMPARISON:  CT of the pelvis on 08/21/2005 FINDINGS: Uterus Measurements: 5.0 x 2.7 x 3.3 centimeters = volume: 2.4 mL. Uterus is retroverted and anteflexed. No uterine mass. Endometrium Thickness: 1.7 millimeters.  No focal abnormality visualized. Right ovary The ovary is not visualized.  No adnexal mass. Left ovary The ovary is not visualized. No adnexal mass. Other findings No abnormal free fluid. IMPRESSION: 1. Normal appearance of the uterus. 2. Ovaries are not visualized. No adnexal masses or free pelvic fluid. Electronically Signed   By: Norva Pavlov M.D.   On: 12/30/2022 13:37   DG Bone Density  Result Date: 12/30/2022 EXAM: DUAL X-RAY ABSORPTIOMETRY (DXA) FOR BONE MINERAL DENSITY IMPRESSION: Dear Dr. Alena Bills, Your patient Janeshia Galas completed a FRAX assessment on 12/30/2022 using the Lunar iDXA DXA System (analysis version: 14.10) manufactured by [employer]. The following summarizes the results of our evaluation. PATIENT BIOGRAPHICAL: Name: Elianny, Antoniak Patient ID: [id number] Birth Date: May 13, 1961 Height:    61.0 in. Gender:     Female    Age:        62.0       Weight:    192.1 lbs. Ethnicity:  White                            Exam Date: 12/30/2022 FRAX* RESULTS:  (version: 3.5) 10-year Probability of Fracture1 Major Osteoporotic Fracture2 Hip Fracture 6.9% 0.8% Population: [place] (Caucasian) Risk Factors: Tobacco User (Current Smoker) Based on Femur (Left) Neck BMD 1 -The 10-year probability of fracture may be lower than reported if the patient has received treatment. 2 -Major Osteoporotic Fracture: Clinical Spine, Forearm, Hip or Shoulder *FRAX is a Armed forces logistics/support/administrative officer of the [employer] of [employer] for Metabolic Bone Disease, a World Science writer (WHO) [employer].  ASSESSMENT: The probability of a major osteoporotic fracture is 6.9% within the next ten years. The probability of a hip fracture is 0.8% within the next  ten years. . Your patient Newell Hoffart completed a BMD test on 12/30/2022 using the [employer] iDXA DXA System (software version: 14.10) manufactured by [employer]. The following summarizes the results of our evaluation. Technologist: [facility] PATIENT BIOGRAPHICAL: Name: Otilia, Karlen Patient ID: [id number] Birth Date: [birth year] Height: 61.0 in. Gender: Female Exam Date: 12/30/2022 Weight: 192.1 lbs. Indications: Caucasian, History of Breast Cancer, Postmenopausal, Tobacco User (Current Smoker) Fractures: Treatments: Calcium, Vitamin D DENSITOMETRY RESULTS: Site      Region    Measured Date Measured Age WHO Classification Young Adult T-score BMD         %Change vs. Previous Significant Change (*) AP Spine L2-L4 12/30/2022 60.0 Normal -0.5 1.148 g/cm2 DualFemur Neck Left 12/30/2022 60.0 Osteopenia -1.3 0.859 g/cm2 ASSESSMENT: The BMD measured at Femur Neck Left is 0.859 g/cm2 with a T-score of -1.3. This patient is considered osteopenic according to World Health Organization [facility]) criteria. The scan quality is good. L-1 was excluded due to degenerative changes. World Science writer [facility]) criteria for post-menopausal, Caucasian Women: Normal:                   T-score at or above -1 SD Osteopenia/low bone mass: T-score between -1 and -2.5 SD Osteoporosis:             T-score at or below -2.5 SD RECOMMENDATIONS: 1. All patients should optimize calcium and vitamin D intake. 2. Consider FDA-approved medical therapies in postmenopausal women and men aged 39 years and older, based on the following: a. A hip or vertebral(clinical or morphometric) fracture b. T-score < -2.5 at the femoral neck or spine after appropriate evaluation to exclude secondary causes c. Low bone mass (T-score between -1.0 and -2.5 at the femoral neck or spine) and a 10-year probability of a  hip fracture > 3% or a 10-year probability of a major osteoporosis-related fracture > 20% based on the [place]-adapted WHO algorithm 3. Clinician judgment and/or patient preferences may indicate treatment for people with 10-year fracture probabilities above or below these levels FOLLOW-UP: People with diagnosed cases of osteoporosis or at high risk for fracture should have regular bone mineral density tests. For patients eligible for Medicare, routine testing is allowed once every 2 years. The testing frequency can be increased to one year for patients who have rapidly progressing disease, those who are receiving or discontinuing medical therapy to restore bone mass, or have additional risk factors. I have reviewed this report, and agree with the above findings. [facility] Electronically Signed   By: Gerome Sam III M.D.   On: 12/30/2022 11:18

## 2023-01-28 ENCOUNTER — Telehealth: Payer: Self-pay | Admitting: *Deleted

## 2023-01-28 NOTE — Telephone Encounter (Signed)
Call from PCP stating that patient has had Pelvic US that did not show any concern, but her ovaries could not be visualized due to gas and she has a fam hours/o ovarian cancer. She is scheduled for Genetic testing. She is having GI difficulties and is scheduled for Upper Endo and Colonoscopy this week, She is asking if patient needs to undergo any further imaging or not. She has left her personal cell number for you if you want to call her

## 2023-01-29 NOTE — Telephone Encounter (Signed)
The number should show up on the bottom of the note, but it is [phone]

## 2023-02-01 ENCOUNTER — Encounter: Payer: Self-pay | Admitting: Gastroenterology

## 2023-02-01 ENCOUNTER — Ambulatory Visit
Admission: RE | Admit: 2023-02-01 | Discharge: 2023-02-01 | Disposition: A | Discharge status: Home / Self Care | Payer: BLUE CROSS/BLUE SHIELD | Attending: Gastroenterology | Admitting: Gastroenterology

## 2023-02-01 ENCOUNTER — Encounter
Admission: RE | Disposition: A | Discharge status: Home / Self Care | Payer: Self-pay | Source: Home / Self Care | Attending: Gastroenterology

## 2023-02-01 ENCOUNTER — Ambulatory Visit: Payer: BLUE CROSS/BLUE SHIELD | Admitting: Anesthesiology

## 2023-02-01 DIAGNOSIS — Z09 Encounter for follow-up examination after completed treatment for conditions other than malignant neoplasm: Secondary | ICD-10-CM | POA: Insufficient documentation

## 2023-02-01 DIAGNOSIS — D123 Benign neoplasm of transverse colon: Secondary | ICD-10-CM | POA: Diagnosis not present

## 2023-02-01 DIAGNOSIS — K449 Diaphragmatic hernia without obstruction or gangrene: Secondary | ICD-10-CM | POA: Insufficient documentation

## 2023-02-01 DIAGNOSIS — K222 Esophageal obstruction: Secondary | ICD-10-CM | POA: Insufficient documentation

## 2023-02-01 DIAGNOSIS — K5730 Diverticulosis of large intestine without perforation or abscess without bleeding: Secondary | ICD-10-CM | POA: Diagnosis not present

## 2023-02-01 DIAGNOSIS — K640 First degree hemorrhoids: Secondary | ICD-10-CM | POA: Insufficient documentation

## 2023-02-01 DIAGNOSIS — K316 Fistula of stomach and duodenum: Secondary | ICD-10-CM | POA: Insufficient documentation

## 2023-02-01 DIAGNOSIS — K2980 Duodenitis without bleeding: Secondary | ICD-10-CM | POA: Diagnosis not present

## 2023-02-01 DIAGNOSIS — Z08 Encounter for follow-up examination after completed treatment for malignant neoplasm: Secondary | ICD-10-CM | POA: Insufficient documentation

## 2023-02-01 DIAGNOSIS — D124 Benign neoplasm of descending colon: Secondary | ICD-10-CM | POA: Insufficient documentation

## 2023-02-01 DIAGNOSIS — Z853 Personal history of malignant neoplasm of breast: Secondary | ICD-10-CM | POA: Insufficient documentation

## 2023-02-01 DIAGNOSIS — K589 Irritable bowel syndrome without diarrhea: Secondary | ICD-10-CM | POA: Insufficient documentation

## 2023-02-01 DIAGNOSIS — Z1211 Encounter for screening for malignant neoplasm of colon: Secondary | ICD-10-CM | POA: Diagnosis present

## 2023-02-01 DIAGNOSIS — Z9011 Acquired absence of right breast and nipple: Secondary | ICD-10-CM | POA: Diagnosis not present

## 2023-02-01 DIAGNOSIS — K219 Gastro-esophageal reflux disease without esophagitis: Secondary | ICD-10-CM | POA: Diagnosis not present

## 2023-02-01 DIAGNOSIS — K6289 Other specified diseases of anus and rectum: Secondary | ICD-10-CM | POA: Diagnosis not present

## 2023-02-01 DIAGNOSIS — D128 Benign neoplasm of rectum: Secondary | ICD-10-CM | POA: Insufficient documentation

## 2023-02-01 DIAGNOSIS — Z86010 Personal history of colonic polyps: Secondary | ICD-10-CM | POA: Insufficient documentation

## 2023-02-01 DIAGNOSIS — R1084 Generalized abdominal pain: Secondary | ICD-10-CM | POA: Diagnosis not present

## 2023-02-01 DIAGNOSIS — K644 Residual hemorrhoidal skin tags: Secondary | ICD-10-CM | POA: Diagnosis not present

## 2023-02-01 DIAGNOSIS — M797 Fibromyalgia: Secondary | ICD-10-CM | POA: Insufficient documentation

## 2023-02-01 DIAGNOSIS — Z79811 Long term (current) use of aromatase inhibitors: Secondary | ICD-10-CM | POA: Diagnosis not present

## 2023-02-01 DIAGNOSIS — D122 Benign neoplasm of ascending colon: Secondary | ICD-10-CM | POA: Insufficient documentation

## 2023-02-01 HISTORY — PX: ESOPHAGOGASTRODUODENOSCOPY (EGD) WITH PROPOFOL: SHX5813

## 2023-02-01 HISTORY — PX: COLONOSCOPY WITH PROPOFOL: SHX5780

## 2023-02-01 SURGERY — COLONOSCOPY WITH PROPOFOL
Anesthesia: General

## 2023-02-01 MED ORDER — SODIUM CHLORIDE 0.9 % IV SOLN: INTRAVENOUS | Status: DC

## 2023-02-01 MED ORDER — PROPOFOL 500 MG/50ML IV EMUL
INTRAVENOUS | Status: DC | PRN
  Administered 2023-02-01 (×2): 30 mg via INTRAVENOUS
  Administered 2023-02-01: 150 ug/kg/min via INTRAVENOUS

## 2023-02-01 MED ORDER — PROPOFOL 10 MG/ML IV BOLUS
INTRAVENOUS | Status: AC
  Filled 2023-02-01: qty 20

## 2023-02-01 NOTE — Transfer of Care (Signed)
Immediate Anesthesia Transfer of Care Note  Patient: Angela Deleon  Procedure(s) Performed: COLONOSCOPY WITH PROPOFOL ESOPHAGOGASTRODUODENOSCOPY (EGD) WITH PROPOFOL  Patient Location: PACU  Anesthesia Type:General  Level of Consciousness: awake and alert   Airway & Oxygen Therapy: Patient Spontanous Breathing and Patient connected to nasal cannula oxygen  Post-op Assessment: Report given to RN and Post -op Vital signs reviewed and stable  Post vital signs: Reviewed and stable  Last Vitals:  Vitals Value Taken Time  BP    Temp    Pulse    Resp    SpO2      Last Pain:  Vitals:   02/01/23 1230  TempSrc: Tympanic  PainSc: 8          Complications: No notable events documented.

## 2023-02-01 NOTE — Op Note (Signed)
[facility] Patient Name: Angela Deleon Procedure Date: 02/01/2023 1:05 PM MRN: [id number] Account #: [id number] Date of Birth: May 26, 1961 Admit Type: Outpatient Age: 62 Room: [facility] ENDO ROOM 2 Gender: Female Note Status: Finalized Instrument Name: Upper Endoscope [id number] Procedure:             Upper GI endoscopy Indications:           Generalized abdominal pain Providers:             Jaynie Collins DO, DO Referring MD:          No Local Md, MD (Referring MD) Medicines:             Monitored Anesthesia Care Complications:         No immediate complications. Estimated blood loss:                         Minimal. Procedure:             Pre-Anesthesia Assessment:                        - Prior to the procedure, a History and Physical was                         performed, and patient medications and allergies were                         reviewed. The patient is competent. The risks and                         benefits of the procedure and the sedation options and                         risks were discussed with the patient. All questions                         were answered and informed consent was obtained.                         Patient identification and proposed procedure were                         verified by the physician, the nurse, the anesthetist                         and the technician in the endoscopy suite. Mental                         Status Examination: alert and oriented. Airway                         Examination: normal oropharyngeal airway and neck                         mobility. Respiratory Examination: clear to                         auscultation. CV Examination: RRR, no murmurs, no S3  or S4. Prophylactic Antibiotics: The patient does not                         require prophylactic antibiotics. Prior                         Anticoagulants: The patient has taken no anticoagulant                          or antiplatelet agents. ASA Grade Assessment: III - A                         patient with severe systemic disease. After reviewing                         the risks and benefits, the patient was deemed in                         satisfactory condition to undergo the procedure. The                         anesthesia plan was to use monitored anesthesia care                         (MAC). Immediately prior to administration of                         medications, the patient was re-assessed for adequacy                         to receive sedatives. The heart rate, respiratory                         rate, oxygen saturations, blood pressure, adequacy of                         pulmonary ventilation, and response to care were                         monitored throughout the procedure. The physical                         status of the patient was re-assessed after the                         procedure.                        After obtaining informed consent, the endoscope was                         passed under direct vision. Throughout the procedure,                         the patient's blood pressure, pulse, and oxygen                         saturations were monitored continuously. The Endoscope  was introduced through the mouth, and advanced to the                         second part of duodenum. The upper GI endoscopy was                         accomplished without difficulty. The patient tolerated                         the procedure well. Findings:      Localized mild inflammation characterized by erythema was found in the       duodenal bulb. Biopsies for histology were taken with a cold forceps for       evaluation of celiac disease. Estimated blood loss was minimal.      The exam of the duodenum was otherwise normal.      The Z-line was regular. Estimated blood loss: none.      Esophagogastric landmarks were identified: the gastroesophageal  junction       was found at 40 cm from the incisors.      A 4 cm hiatal hernia was present. Estimated blood loss: none.      A single area of ectopic gastric mucosa was found in the upper third of       the esophagus, 15 cm from the incisors. Estimated blood loss: none.      One benign-appearing, intrinsic mild (non-circumferential scarring)       stenosis was found 40 cm from the incisors. This stenosis measured less       than one cm (in length). The stenosis was traversed. The scope was       withdrawn. Dilation was performed with a Maloney dilator with no       resistance at 52 Fr. The dilation site was examined following endoscope       reinsertion and showed mild mucosal disruption. Estimated blood loss was       minimal.      A appearingly developing fistula was found in the gastric antrum.       Biopsies were taken with a cold forceps for histology. Estimated blood       loss was minimal.      Normal mucosa was found in the entire examined stomach. Biopsies were       taken with a cold forceps for Helicobacter pylori testing. Estimated       blood loss was minimal. Impression:            - Duodenitis. Biopsied.                        - Z-line regular.                        - Esophagogastric landmarks identified.                        - 4 cm hiatal hernia.                        - Ectopic gastric mucosa in the upper third of the                         esophagus.                        -  Benign-appearing esophageal stenosis. Dilated.                        - Gastric fistula.                        - Normal mucosa was found in the entire stomach.                         Biopsied. Recommendation:        - Patient has a contact number available for                         emergencies. The signs and symptoms of potential                         delayed complications were discussed with the patient.                         Return to normal activities tomorrow. Written                          discharge instructions were provided to the patient.                        - Discharge patient to home.                        - Resume previous diet.                        - Continue present medications.                        - Await pathology results.                        - Perform CT with PO contrast to evaluate possible                         fistulous tract                        - Return to GI clinic as previously scheduled.                        - The findings and recommendations were discussed with                         the patient. Procedure Code(s):     --- Professional ---                        [phone], Esophagogastroduodenoscopy, flexible,                         transoral; with biopsy, single or multiple                        43450, Dilation of esophagus, by unguided sound or  bougie, single or multiple passes Diagnosis Code(s):     --- Professional ---                        K29.80, Duodenitis without bleeding                        K44.9, Diaphragmatic hernia without obstruction or                         gangrene                        Q40.2, Other specified congenital malformations of                         stomach                        K22.2, Esophageal obstruction                        K31.6, Fistula of stomach and duodenum                        R10.84, Generalized abdominal pain CPT copyright 2022 American Medical Association. All rights reserved. The codes documented in this report are preliminary and upon coder review may  be revised to meet current compliance requirements. Attending Participation:      I personally performed the entire procedure. Elfredia Nevins, DO Jaynie Collins DO, DO 02/01/2023 2:05:42 PM This report has been signed electronically. Number of Addenda: 0 Note Initiated On: 02/01/2023 1:05 PM Estimated Blood Loss:  Estimated blood loss was minimal.      [facility]

## 2023-02-01 NOTE — Anesthesia Postprocedure Evaluation (Signed)
Anesthesia Post Note  Patient: Angela Deleon  Procedure(s) Performed: COLONOSCOPY WITH PROPOFOL ESOPHAGOGASTRODUODENOSCOPY (EGD) WITH PROPOFOL  Patient location during evaluation: PACU Anesthesia Type: General Level of consciousness: awake and alert, oriented and patient cooperative Pain management: pain level controlled Vital Signs Assessment: post-procedure vital signs reviewed and stable Respiratory status: spontaneous breathing, nonlabored ventilation and respiratory function stable Cardiovascular status: blood pressure returned to baseline and stable Postop Assessment: adequate PO intake Anesthetic complications: no   No notable events documented.   Last Vitals:  Vitals:   02/01/23 1230 02/01/23 1406  BP: (!) 161/75 (!) 128/94  Pulse: 65   Resp: 19   Temp: (!) 35.8 C 36.9 C  SpO2: 98%     Last Pain:  Vitals:   02/01/23 1426  TempSrc:   PainSc: 0-No pain                 Reed Breech

## 2023-02-01 NOTE — Interval H&P Note (Signed)
History and Physical Interval Note: Preprocedure H&P from 02/01/23  was reviewed and there was no interval change after seeing and examining the patient.  Written consent was obtained from the patient after discussion of risks, benefits, and alternatives. Patient has consented to proceed with Esophagogastroduodenoscopy and Colonoscopy with possible intervention   02/01/2023 12:59 PM  Angela Deleon  has presented today for surgery, with the diagnosis of Nausea/Vomiting GERD Abd Pain.  The various methods of treatment have been discussed with the patient and family. After consideration of risks, benefits and other options for treatment, the patient has consented to  Procedure(s): COLONOSCOPY WITH PROPOFOL (N/A) ESOPHAGOGASTRODUODENOSCOPY (EGD) WITH PROPOFOL (N/A) as a surgical intervention.  The patient's history has been reviewed, patient examined, no change in status, stable for surgery.  I have reviewed the patient's chart and labs.  Questions were answered to the patient's satisfaction.     Jaynie Collins

## 2023-02-01 NOTE — Op Note (Signed)
[facility] Patient Name: Angela Deleon Procedure Date: 02/01/2023 1:04 PM MRN: [id number] Account #: [id number] Date of Birth: 09-Apr-1961 Admit Type: Outpatient Age: 62 Room: [facility] ENDO ROOM 2 Gender: Female Note Status: Finalized Instrument Name: Colonoscope [id number] Procedure:             Colonoscopy Indications:           High risk colon cancer surveillance: Personal history                         of colonic polyps Providers:             Jaynie Collins DO, DO Referring MD:          No Local Md, MD (Referring MD) Medicines:             Monitored Anesthesia Care Complications:         No immediate complications. Estimated blood loss:                         Minimal. Procedure:             Pre-Anesthesia Assessment:                        - Prior to the procedure, a History and Physical was                         performed, and patient medications and allergies were                         reviewed. The patient is competent. The risks and                         benefits of the procedure and the sedation options and                         risks were discussed with the patient. All questions                         were answered and informed consent was obtained.                         Patient identification and proposed procedure were                         verified by the physician, the nurse, the anesthetist                         and the technician in the endoscopy suite. Mental                         Status Examination: alert and oriented. Airway                         Examination: normal oropharyngeal airway and neck                         mobility. Respiratory Examination: poor air movement.  CV Examination: normal. Prophylactic Antibiotics: The                         patient does not require prophylactic antibiotics.                         Prior Anticoagulants: The patient has taken no                          anticoagulant or antiplatelet agents. ASA Grade                         Assessment: III - A patient with severe systemic                         disease. After reviewing the risks and benefits, the                         patient was deemed in satisfactory condition to                         undergo the procedure. The anesthesia plan was to use                         monitored anesthesia care (MAC). Immediately prior to                         administration of medications, the patient was                         re-assessed for adequacy to receive sedatives. The                         heart rate, respiratory rate, oxygen saturations,                         blood pressure, adequacy of pulmonary ventilation, and                         response to care were monitored throughout the                         procedure. The physical status of the patient was                         re-assessed after the procedure.                        After obtaining informed consent, the colonoscope was                         passed under direct vision. Throughout the procedure,                         the patient's blood pressure, pulse, and oxygen                         saturations were monitored continuously. The  Colonoscope was introduced through the anus and                         advanced to the the cecum, identified by appendiceal                         orifice and ileocecal valve. The colonoscopy was                         technically difficult and complex due to a redundant                         colon and the patient's body habitus, sleep apnea,                         coughing. Successful completion of the procedure was                         aided by straightening and shortening the scope to                         obtain bowel loop reduction, using scope torsion,                         applying abdominal pressure and lavage. The patient                          tolerated the procedure fairly well. The quality of                         the bowel preparation was evaluated using the BBPS                         [facility] Bowel Preparation Scale) with scores of: Right                         Colon = 2 (minor amount of residual staining, small                         fragments of stool and/or opaque liquid, but mucosa                         seen well), Transverse Colon = 2 (minor amount of                         residual staining, small fragments of stool and/or                         opaque liquid, but mucosa seen well) and Left Colon =                         2 (minor amount of residual staining, small fragments                         of stool and/or opaque liquid, but mucosa seen well).  The total BBPS score equals 6. Fair Prep. The                         ileocecal valve, appendiceal orifice, and rectum were                         photographed. Findings:      Skin tags were found on perianal exam.      Multiple small-mouthed diverticula were found in the entire colon.       Estimated blood loss: none.      Anal papilla(e) were hypertrophied. Estimated blood loss: none.      Non-bleeding internal hemorrhoids were found during retroflexion. The       hemorrhoids were Grade I (internal hemorrhoids that do not prolapse).       Estimated blood loss: none.      Six sessile polyps were found in the rectum, descending colon and       ascending colon. The polyps were 1 to 2 mm in size. These polyps were       removed with a jumbo cold forceps. Resection and retrieval were       complete. Estimated blood loss was minimal.      Two sessile polyps were found in the descending colon and transverse       colon. The polyps were 3 to 4 mm in size. These polyps were removed with       a cold snare. Resection and retrieval were complete. Estimated blood       loss was minimal.      Prep, body habitus, and abdominal breathing limited  exam.      The exam was otherwise without abnormality on direct and retroflexion       views. Impression:            - Perianal skin tags found on perianal exam.                        - Diverticulosis in the entire examined colon.                        - Anal papilla(e) were hypertrophied.                        - Non-bleeding internal hemorrhoids.                        - Six 1 to 2 mm polyps in the rectum, in the                         descending colon and in the ascending colon, removed                         with a jumbo cold forceps. Resected and retrieved.                        - Two 3 to 4 mm polyps in the descending colon and in                         the transverse colon, removed with a cold snare.  Resected and retrieved.                        - The examination was otherwise normal on direct and                         retroflexion views. Recommendation:        - Patient has a contact number available for                         emergencies. The signs and symptoms of potential                         delayed complications were discussed with the patient.                         Return to normal activities tomorrow. Written                         discharge instructions were provided to the patient.                        - Discharge patient to home.                        - Resume previous diet.                        - Continue present medications.                        - No ibuprofen, naproxen, or other non-steroidal                         anti-inflammatory drugs for 5 days after polyp removal.                        - Repeat colonoscopy in 6 months for surveillance.                        - Return to GI office as previously scheduled.                        - The findings and recommendations were discussed with                         the patient. Procedure Code(s):     --- Professional ---                        [phone], Colonoscopy, flexible;  with removal of                         tumor(s), polyp(s), or other lesion(s) by snare                         technique                        45380, 59, Colonoscopy, flexible; with biopsy, single  or multiple Diagnosis Code(s):     --- Professional ---                        Z86.010, Personal history of colonic polyps                        K64.0, First degree hemorrhoids                        K62.89, Other specified diseases of anus and rectum                        D12.8, Benign neoplasm of rectum                        D12.2, Benign neoplasm of ascending colon                        D12.4, Benign neoplasm of descending colon                        D12.3, Benign neoplasm of transverse colon (hepatic                         flexure or splenic flexure)                        K64.4, Residual hemorrhoidal skin tags                        K57.30, Diverticulosis of large intestine without                         perforation or abscess without bleeding CPT copyright 2022 American Medical Association. All rights reserved. The codes documented in this report are preliminary and upon coder review may  be revised to meet current compliance requirements. Attending Participation:      I personally performed the entire procedure. Elfredia Nevins, DO Jaynie Collins DO, DO 02/01/2023 2:12:36 PM This report has been signed electronically. Number of Addenda: 0 Note Initiated On: 02/01/2023 1:04 PM Scope Withdrawal Time: 0 hours 16 minutes 29 seconds  Total Procedure Duration: 0 hours 32 minutes 13 seconds  Estimated Blood Loss:  Estimated blood loss was minimal.      [facility]

## 2023-02-01 NOTE — Anesthesia Preprocedure Evaluation (Signed)
Anesthesia Evaluation  Patient identified by MRN, date of birth, ID band Patient awake    Reviewed: Allergy & Precautions, H&P , NPO status , Patient's Chart, lab work & pertinent test results, reviewed documented beta blocker date and time   Airway Mallampati: II   Neck ROM: full    Dental  (+) Poor Dentition   Pulmonary asthma , Current Smoker   Pulmonary exam normal        Cardiovascular Exercise Tolerance: Good hypertension, On Medications negative cardio ROS Normal cardiovascular exam Rhythm:regular Rate:Normal     Neuro/Psych  PSYCHIATRIC DISORDERS Anxiety Depression     Neuromuscular disease    GI/Hepatic Neg liver ROS,GERD  Medicated,,  Endo/Other  negative endocrine ROS    Renal/GU negative Renal ROS  negative genitourinary   Musculoskeletal   Abdominal   Peds  Hematology  (+) Blood dyscrasia, anemia   Anesthesia Other Findings Past Medical History: No date: Acid reflux No date: Anxiety No date: Asthma 08/2022: Breast cancer (HCC)     Comment:  right No date: Depression No date: Fibromyalgia No date: Hypertension, essential Past Surgical History: No date: BREAST BIOPSY; Right     Comment:  2006? neg 08/13/2022: BREAST BIOPSY; Right     Comment:  rt br u/s bx 11:00  venus clip path pending 08/13/2022: BREAST BIOPSY; Right     Comment:  [place] RT BREAST BX W LOC DEV 1ST LESION IMG BX SPEC [place]               GUIDE 08/13/2022 [facility] 08/13/2022: BREAST BIOPSY; Right     Comment:  [place] RT BREAST BX W LOC DEV EA ADD LESION IMG BX SPEC [place]               GUIDE 08/13/2022 [facility] No date: COLONOSCOPY No date: CYST EXCISION; Right     Comment:  arm 11/14/2013: DIRECT LARYNGOSCOPY 08/27/2022: MASTECTOMY W/ SENTINEL NODE BIOPSY; Right     Comment:  Procedure: MASTECTOMY WITH SENTINEL LYMPH NODE BIOPSY;                Surgeon: Carolan Shiver, MD;  Location: [facility] ORS;                Service: General;  Laterality: Right; 1992: TUBAL LIGATION   Reproductive/Obstetrics negative OB ROS                             Anesthesia Physical Anesthesia Plan  ASA: 3  Anesthesia Plan: General   Post-op Pain Management:    Induction:   PONV Risk Score and Plan:   Airway Management Planned:   Additional Equipment:   Intra-op Plan:   Post-operative Plan:   Informed Consent: I have reviewed the patients History and Physical, chart, labs and discussed the procedure including the risks, benefits and alternatives for the proposed anesthesia with the patient or authorized representative who has indicated his/her understanding and acceptance.     Dental Advisory Given  Plan Discussed with: CRNA  Anesthesia Plan Comments:        Anesthesia Quick Evaluation

## 2023-02-01 NOTE — H&P (Signed)
Pre-Procedure H&P   Patient ID: Angela Deleon is a 62 y.o. female.  Gastroenterology Provider: Jaynie Collins, DO  Referring Provider: Tawni Pummel, PA PCP: Michaelyn Barter, MD  Date: 02/01/2023  HPI [name] is a 62 y.o. female who presents today for Esophagogastroduodenoscopy and Colonoscopy for GERD, nausea vomiting, abdominal pain .  Patient was treated for breast cancer with right mastectomy in 08/2022.  She was started on Femara and since then has noted increasing nausea reflux and regurgitation.  She notes dysphagia with food sticking in her mid chest.  She has a every other day bowel movement without melena or hematochezia but increased straining.  She does have a history of IBS and fibromyalgia  Was taking ibuprofen 600 to 800 mg a day and using THC. Currently 1 pack per 3 days of tobacco.  Folic acid 5.1 ferritin 20 iron sat 5% hemoglobin 10.5 MCV 80 platelets 205,000 creatinine 0.9  There is question of mother having colorectal cancer.  She also had ovarian cancer.   Past Medical History:  Diagnosis Date   Acid reflux    Anxiety    Asthma    Breast cancer (HCC) 08/2022   right   Depression    Fibromyalgia    Hypertension, essential     Past Surgical History:  Procedure Laterality Date   BREAST BIOPSY Right    2006? neg   BREAST BIOPSY Right 08/13/2022   rt br u/s bx 11:00  venus clip path pending   BREAST BIOPSY Right 08/13/2022   [place] RT BREAST BX W LOC DEV 1ST LESION IMG BX SPEC US GUIDE 08/13/2022 [facility]   BREAST BIOPSY Right 08/13/2022   [place] RT BREAST BX W LOC DEV EA ADD LESION IMG BX SPEC US GUIDE 08/13/2022 [facility]   COLONOSCOPY     CYST EXCISION Right    arm   DIRECT LARYNGOSCOPY  11/14/2013   MASTECTOMY W/ SENTINEL NODE BIOPSY Right 08/27/2022   Procedure: MASTECTOMY WITH SENTINEL LYMPH NODE BIOPSY;  Surgeon: Carolan Shiver, MD;  Location: [facility];  Service: General;  Laterality: Right;   TUBAL LIGATION   1992    Family History There is question of mother having colorectal cancer.  She also had ovarian cancer. No other h/o GI disease or malignancy  Review of Systems  Constitutional:  Negative for activity change, appetite change, chills, diaphoresis, fatigue, fever and unexpected weight change.  HENT:  Positive for trouble swallowing. Negative for voice change.   Respiratory:  Negative for shortness of breath and wheezing.   Cardiovascular:  Negative for chest pain, palpitations and leg swelling.  Gastrointestinal:  Positive for abdominal pain, constipation, nausea and vomiting. Negative for abdominal distention, anal bleeding, blood in stool, diarrhea and rectal pain.  Musculoskeletal:  Negative for arthralgias and myalgias.  Skin:  Negative for color change and pallor.  Neurological:  Negative for dizziness, syncope and weakness.  Psychiatric/Behavioral:  Negative for confusion.   All other systems reviewed and are negative.    Medications No current facility-administered medications on file prior to encounter.   Current Outpatient Medications on File Prior to Encounter  Medication Sig Dispense Refill   albuterol (VENTOLIN HFA) 108 (90 Base) MCG/ACT inhaler Inhale 2 puffs into the lungs every 6 (six) hours as needed.     atorvastatin (LIPITOR) 20 MG tablet Take 20 mg by mouth daily.     buPROPion (WELLBUTRIN XL) 150 MG 24 hr tablet Take 150 mg by mouth every morning.  famotidine (PEPCID) 20 MG tablet Take 20 mg by mouth daily.     gabapentin (NEURONTIN) 400 MG capsule Take 400 mg by mouth 3 (three) times daily.     lisinopril-hydrochlorothiazide (ZESTORETIC) 20-12.5 MG tablet Take 1 tablet by mouth daily. 30 tablet 1   QUEtiapine (SEROQUEL) 50 MG tablet Take 50 mg by mouth at bedtime.     sertraline (ZOLOFT) 100 MG tablet Take 150 mg by mouth daily.     clonazePAM (KLONOPIN) 1 MG tablet Take 1 mg by mouth 2 (two) times daily as needed for anxiety.     doxycycline (VIBRA-TABS)  100 MG tablet Take 100 mg by mouth 2 (two) times daily. (Patient not taking: Reported on 01/15/2023)     escitalopram (LEXAPRO) 20 MG tablet Take 20 mg by mouth daily. (Patient not taking: Reported on 08/27/2022)     ibuprofen (ADVIL) 200 MG tablet Take 600 mg by mouth daily as needed. (Patient not taking: Reported on 12/25/2022)     letrozole (FEMARA) 2.5 MG tablet Take 1 tablet (2.5 mg total) by mouth daily. (Patient not taking: Reported on 01/15/2023) 90 tablet 2   methocarbamol (ROBAXIN) 500 MG tablet Take 1 tablet (500 mg total) by mouth 4 (four) times daily. (Patient not taking: Reported on 01/15/2023) 15 tablet 0   oxyCODONE-acetaminophen (PERCOCET) 5-325 MG tablet Take 1 tablet by mouth every 4 (four) hours as needed for severe pain. (Patient not taking: Reported on 12/25/2022) 20 tablet 0   SUMAtriptan (IMITREX) 50 MG tablet Take 1 tablet (50 mg total) by mouth once for 1 dose. May repeat in 2 hours if headache persists or recurs. (Patient not taking: Reported on 01/15/2023) 10 tablet 0    Pertinent medications related to GI and procedure were reviewed by me with the patient prior to the procedure   Current Facility-Administered Medications:    0.9 %  sodium chloride infusion, , Intravenous, Continuous, Jaynie Collins, DO, Last Rate: 20 mL/hr at 02/01/23 1244, New Bag at 02/01/23 1244  sodium chloride 20 mL/hr at 02/01/23 1244       Allergies  Allergen Reactions   Iodinated Contrast Media Swelling   Penicillins Anaphylaxis and Swelling   Allergies were reviewed by me prior to the procedure  Objective   Body mass index is 36.81 kg/m. Vitals:   02/01/23 1230  BP: (!) 161/75  Pulse: 65  Resp: 19  Temp: (!) 96.5 F (35.8 C)  TempSrc: Tympanic  SpO2: 98%  Weight: 88.4 kg  Height: 5\' 1"  (1.549 m)     Physical Exam Vitals and nursing note reviewed.  Constitutional:      General: She is not in acute distress.    Appearance: Normal appearance. She is obese. She is not  ill-appearing, toxic-appearing or diaphoretic.  HENT:     Head: Normocephalic and atraumatic.     Nose: Nose normal.     Mouth/Throat:     Mouth: Mucous membranes are moist.     Pharynx: Oropharynx is clear.  Eyes:     General: No scleral icterus.    Extraocular Movements: Extraocular movements intact.  Cardiovascular:     Rate and Rhythm: Normal rate and regular rhythm.     Heart sounds: Normal heart sounds. No murmur heard.    No friction rub. No gallop.  Pulmonary:     Effort: Pulmonary effort is normal. No respiratory distress.     Breath sounds: No wheezing, rhonchi or rales.     Comments: Diminished breath sounds bilaterally  Abdominal:     General: Bowel sounds are normal. There is no distension.     Palpations: Abdomen is soft.     Tenderness: There is no abdominal tenderness. There is no guarding or rebound.  Musculoskeletal:     Cervical back: Neck supple.     Right lower leg: No edema.     Left lower leg: No edema.  Skin:    General: Skin is warm and dry.     Coloration: Skin is not jaundiced or pale.  Neurological:     General: No focal deficit present.     Mental Status: She is alert and oriented to person, place, and time. Mental status is at baseline.  Psychiatric:        Mood and Affect: Mood normal.        Behavior: Behavior normal.        Thought Content: Thought content normal.        Judgment: Judgment normal.      Assessment:  [name] is a 63 y.o. female  who presents today for Esophagogastroduodenoscopy and Colonoscopy for  GERD, nausea vomiting, abdominal pain.  Plan:  Esophagogastroduodenoscopy and Colonoscopy with possible intervention today  Esophagogastroduodenoscopy and Colonoscopy with possible biopsy, control of bleeding, polypectomy, and interventions as necessary has been discussed with the patient/patient representative. Informed consent was obtained from the patient/patient representative after explaining the indication, nature,  and risks of the procedure including but not limited to death, bleeding, perforation, missed neoplasm/lesions, cardiorespiratory compromise, and reaction to medications. Opportunity for questions was given and appropriate answers were provided. Patient/patient representative has verbalized understanding is amenable to undergoing the procedure.   Jaynie Collins, DO  [facility]  Portions of the record may have been created with voice recognition software. Occasional wrong-word or 'sound-a-like' substitutions may have occurred due to the inherent limitations of voice recognition software.  Read the chart carefully and recognize, using context, where substitutions may have occurred.

## 2023-02-02 ENCOUNTER — Encounter: Payer: Self-pay | Admitting: Gastroenterology

## 2023-02-02 NOTE — Anesthesia Postprocedure Evaluation (Signed)
Anesthesia Post Note  Patient: Serra Lind  Procedure(s) Performed: COLONOSCOPY WITH PROPOFOL ESOPHAGOGASTRODUODENOSCOPY (EGD) WITH PROPOFOL  Patient location during evaluation: PACU Anesthesia Type: General Level of consciousness: awake and alert Pain management: pain level controlled Vital Signs Assessment: post-procedure vital signs reviewed and stable Respiratory status: spontaneous breathing, nonlabored ventilation, respiratory function stable and patient connected to nasal cannula oxygen Cardiovascular status: blood pressure returned to baseline and stable Postop Assessment: no apparent nausea or vomiting Anesthetic complications: no   No notable events documented.   Last Vitals:  Vitals:   02/01/23 1230 02/01/23 1406  BP: (!) 161/75 (!) 128/94  Pulse: 65   Resp: 19   Temp: (!) 35.8 C 36.9 C  SpO2: 98%     Last Pain:  Vitals:   02/02/23 0735  TempSrc:   PainSc: 0-No pain                 Yevette Edwards

## 2023-02-04 ENCOUNTER — Encounter: Payer: Self-pay | Admitting: Gastroenterology

## 2023-02-04 ENCOUNTER — Other Ambulatory Visit: Payer: Self-pay | Admitting: Gastroenterology

## 2023-02-04 DIAGNOSIS — R198 Other specified symptoms and signs involving the digestive system and abdomen: Secondary | ICD-10-CM

## 2023-02-11 ENCOUNTER — Encounter: Payer: Self-pay | Admitting: Gastroenterology

## 2023-02-15 ENCOUNTER — Inpatient Hospital Stay: Admission: RE | Admit: 2023-02-15 | Payer: Disability Insurance | Source: Ambulatory Visit

## 2023-02-17 ENCOUNTER — Encounter: Payer: Self-pay | Admitting: *Deleted

## 2023-02-17 ENCOUNTER — Encounter: Payer: Self-pay | Admitting: Licensed Clinical Social Worker

## 2023-02-17 DIAGNOSIS — C50911 Malignant neoplasm of unspecified site of right female breast: Secondary | ICD-10-CM

## 2023-02-17 NOTE — Progress Notes (Signed)
[facility] Clinical Social Work  Clinical Social Work was referred by medical provider for assessment of psychosocial needs.  Clinical Social Worker attempted to contact patient by phone  to offer support and assess for needs.  CSW left voicemail with contact information and request for return call.       Mckinzey Entwistle, LCSW  Clinical Social Worker [facility]        Patient is participating in a Managed Medicaid Plan:  Yes 

## 2023-02-17 NOTE — Progress Notes (Signed)
Ms. Caulfield called with housing concerns.  She currently lives in a friends RV and was wondering what resources are available to help with housing.   Social work referral entered.

## 2023-02-19 ENCOUNTER — Inpatient Hospital Stay: Payer: BLUE CROSS/BLUE SHIELD | Attending: Internal Medicine | Admitting: Internal Medicine

## 2023-02-19 ENCOUNTER — Inpatient Hospital Stay: Payer: BLUE CROSS/BLUE SHIELD

## 2023-02-19 ENCOUNTER — Encounter: Payer: Self-pay | Admitting: Internal Medicine

## 2023-02-19 ENCOUNTER — Ambulatory Visit: Payer: Disability Insurance | Admitting: Internal Medicine

## 2023-02-19 VITALS — BP 128/74 | HR 72 | Temp 97.1°F | Wt 195.5 lb

## 2023-02-19 DIAGNOSIS — Z170 Estrogen receptor positive status [ER+]: Secondary | ICD-10-CM

## 2023-02-19 DIAGNOSIS — D509 Iron deficiency anemia, unspecified: Secondary | ICD-10-CM | POA: Insufficient documentation

## 2023-02-19 DIAGNOSIS — Z8041 Family history of malignant neoplasm of ovary: Secondary | ICD-10-CM | POA: Insufficient documentation

## 2023-02-19 DIAGNOSIS — C50911 Malignant neoplasm of unspecified site of right female breast: Secondary | ICD-10-CM | POA: Diagnosis not present

## 2023-02-19 DIAGNOSIS — M8589 Other specified disorders of bone density and structure, multiple sites: Secondary | ICD-10-CM | POA: Insufficient documentation

## 2023-02-19 DIAGNOSIS — D508 Other iron deficiency anemias: Secondary | ICD-10-CM

## 2023-02-19 DIAGNOSIS — N644 Mastodynia: Secondary | ICD-10-CM | POA: Insufficient documentation

## 2023-02-19 DIAGNOSIS — C50411 Malignant neoplasm of upper-outer quadrant of right female breast: Secondary | ICD-10-CM | POA: Insufficient documentation

## 2023-02-19 DIAGNOSIS — F418 Other specified anxiety disorders: Secondary | ICD-10-CM | POA: Diagnosis not present

## 2023-02-19 DIAGNOSIS — F17210 Nicotine dependence, cigarettes, uncomplicated: Secondary | ICD-10-CM | POA: Insufficient documentation

## 2023-02-19 DIAGNOSIS — M79601 Pain in right arm: Secondary | ICD-10-CM | POA: Diagnosis not present

## 2023-02-19 DIAGNOSIS — I10 Essential (primary) hypertension: Secondary | ICD-10-CM | POA: Diagnosis not present

## 2023-02-19 DIAGNOSIS — Z800 Family history of malignant neoplasm of digestive organs: Secondary | ICD-10-CM | POA: Insufficient documentation

## 2023-02-19 DIAGNOSIS — Z79811 Long term (current) use of aromatase inhibitors: Secondary | ICD-10-CM

## 2023-02-19 LAB — IRON AND TIBC
Iron: 49 ug/dL (ref 28–170)
Saturation Ratios: 11 % (ref 10.4–31.8)
TIBC: 438 ug/dL (ref 250–450)
UIBC: 389 ug/dL

## 2023-02-19 LAB — CBC WITH DIFFERENTIAL/PLATELET
Abs Immature Granulocytes: 0.05 10*3/uL (ref 0.00–0.07)
Basophils Absolute: 0.1 10*3/uL (ref 0.0–0.1)
Basophils Relative: 1 %
Eosinophils Absolute: 0.1 10*3/uL (ref 0.0–0.5)
Eosinophils Relative: 1 %
HCT: 44.1 % (ref 36.0–46.0)
Hemoglobin: 14.1 g/dL (ref 12.0–15.0)
Immature Granulocytes: 1 %
Lymphocytes Relative: 23 %
Lymphs Abs: 2 10*3/uL (ref 0.7–4.0)
MCH: 26.9 pg (ref 26.0–34.0)
MCHC: 32 g/dL (ref 30.0–36.0)
MCV: 84.2 fL (ref 80.0–100.0)
Monocytes Absolute: 0.6 10*3/uL (ref 0.1–1.0)
Monocytes Relative: 7 %
Neutro Abs: 6.1 10*3/uL (ref 1.7–7.7)
Neutrophils Relative %: 67 %
Platelets: 196 10*3/uL (ref 150–400)
RBC: 5.24 MIL/uL — ABNORMAL HIGH (ref 3.87–5.11)
RDW: 18.2 % — ABNORMAL HIGH (ref 11.5–15.5)
WBC: 9 10*3/uL (ref 4.0–10.5)
nRBC: 0 % (ref 0.0–0.2)

## 2023-02-19 LAB — COMPREHENSIVE METABOLIC PANEL
ALT: 12 U/L (ref 0–44)
AST: 21 U/L (ref 15–41)
Albumin: 3.9 g/dL (ref 3.5–5.0)
Alkaline Phosphatase: 83 U/L (ref 38–126)
Anion gap: 8 (ref 5–15)
BUN: 16 mg/dL (ref 6–20)
CO2: 29 mmol/L (ref 22–32)
Calcium: 9.3 mg/dL (ref 8.9–10.3)
Chloride: 104 mmol/L (ref 98–111)
Creatinine, Ser: 0.91 mg/dL (ref 0.44–1.00)
GFR, Estimated: >60 mL/min (ref >60)
Glucose, Bld: 110 mg/dL — ABNORMAL HIGH (ref 70–99)
Potassium: 4.2 mmol/L (ref 3.5–5.1)
Sodium: 141 mmol/L (ref 135–145)
Total Bilirubin: 0.5 mg/dL (ref 0.3–1.2)
Total Protein: 7.7 g/dL (ref 6.5–8.1)

## 2023-02-19 LAB — FERRITIN: Ferritin: 12 ng/mL (ref 11–307)

## 2023-02-19 NOTE — Progress Notes (Signed)
[facility] CONSULT NOTE  Patient Care Team: Michaelyn Barter, MD as PCP - General (Oncology) Hulen Luster, RN as Oncology Nurse Navigator Carolan Shiver, MD as Consulting Physician (General Surgery)   CANCER STAGING   Cancer Staging  Breast cancer [facility]) Staging form: Breast, AJCC 8th Edition - Pathologic stage from 08/27/2022: Stage IB (pT2, pN1a(sn), cM0, G2, ER+, PR+, HER2-, Oncotype DX score: 5) - Signed by Michaelyn Barter, MD on 09/17/2022 Stage prefix: Initial diagnosis Method of lymph node assessment: Sentinel lymph node biopsy Multigene prognostic tests performed: Oncotype DX Recurrence score range: Less than 11 Histologic grading system: 3 grade system   ASSESSMENT & PLAN:  Angela Deleon 62 y.o. female with pmh of fibromyalgia, hypertension and depression was seen in medical oncology to discuss treatment for right-sided breast cancer.  # Right breast invasive ductal cancer, pathologic stage Ib (pT2N1) ER/PR+, HER2- -self palpable. S/p US guided biopsy on 08/13/2022.  -s/p right-sided mastectomy with SLNB by Dr. Maia Plan on 08/27/2022.  Pathology showed 2.8 cm invasive mammary cancer upper outer quadrant, grade 2, 2 smaller foci in the lower outer quadrant measuring 5 mm and 2.5 mm grade 2, margins negative, 1/4 lymph node positive for macrometastatic disease 3.2 mm, ENE negative, no LVI, pathologic stage pT2 N1a   -Oncotype DX testing showed low recurrence score of 5.  So there is no benefit of chemotherapy.  Was seen by Dr. Rushie Chestnut and no role for radiation.  -DEXA scan from 12/30/2022 showed normal density in the spine, osteopenia in hip.  Continue with calcium vitamin D supplements.  -Patient started letrozole on 11/13/2022.  Discontinued on 12/25/2022 due to GI symptoms such as nausea, vomiting, worsening fatigue, myalgias, arthralgias and worsened constipation from baseline.  Switch to exemestane 25 mg on 01/15/2023.  Tolerating much better.  Still has some  nausea and vomiting but significantly improved.  Will continue.  # Iron deficiency anemia # Nausea vomiting -Patient is on iron supplements.  Hemoglobin has improved from 10.5-14.  Iron panel is pending.  -s/p colonoscopy with Dr. Timothy Lasso on 02/01/2023 which showed diverticulosis in entire colon, nonbleeding internal hemorrhoids and multiple polyps which were removed.  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  Dr. Timothy Lasso planned CT abdomen pelvis with contrast to further evaluate gastric fistula but has not been approved by insurance yet.  # Left breast pain -Reports focal pain with knot-like sensation. Had mammogram on 10/22/2022 which did not show any concerning masses.  Pain is self resolving.  # Right arm pain -Present since surgery.  Neuropathic -Is on gabapentin 400 mg 3 times daily for fibromyalgia but not helping with the pain.  Patient plans to discuss with her psychiatry about increasing the dose of gabapentin.  I also mentioned about alternative Cymbalta.  # Family history of colon, uterine and ovarian cancer -[facility] gene panel from 09/29/2022 negative.  # Anxiety -Patient follows with [facility].  She is on multiple medications-Klonopin, Lexapro, Wellbutrin, Seroquel.   Orders Placed This Encounter  Procedures   CBC with Differential/Platelet    Standing Status:   Future    Number of Occurrences:   1    Standing Expiration Date:   02/19/2024   Iron and TIBC    Standing Status:   Future    Number of Occurrences:   1    Standing Expiration Date:   02/19/2024   Ferritin    Standing Status:  Future    Number of Occurrences:   1    Standing Expiration Date:   02/19/2024   CBC with Differential/Platelet    Standing Status:   Future    Standing Expiration Date:   02/19/2024   Comprehensive metabolic panel    Standing Status:   Future    Standing  Expiration Date:   02/19/2024   RTC in 5 months for MD visit, labs to assess toxicity check for Aromasin  The total time spent in the appointment was 30 minutes encounter with patients including review of chart and various tests results, discussions about plan of care and coordination of care plan   All questions were answered. The patient knows to call the clinic with any problems, questions or concerns. No barriers to learning was detected.  Michaelyn Barter, MD 6/7/20241:05 PM   HISTORY OF PRESENTING ILLNESS:  Angela Deleon 62 y.o. female with pmh of fibromyalgia, hypertension and depression was seen in medical oncology to discuss treatment for right-sided breast cancer.  Interval history Patient was seen today accompanied by husband for follow-up as toxicity check for Aromasin.  She has been taking Aromasin for past 5 weeks and tolerating significantly better than letrozole.  Her GI symptoms have significantly improved.  She still has the nausea and episode of vomiting couple of times a week.  Had a GI workup with Dr. Timothy Lasso.  Upper endoscopy showed gastric fistula and was supposed to get CT abdomen pelvis with contrast to further evaluate but reports has not been approved by the insurance yet.  She was also inquiring about repeat transvaginal ultrasound to visualize the ovaries because of ongoing pelvic pain and family history of ovarian cancer.  We discussed that we will be able to visualize ovary on the CT scan so we can hold off on the ultrasound.  She will follow-up with [facility] about her housing concerns.  She is also very worried about losing her insurance.  She has been working with the social security person regarding that.  I have reviewed her chart and materials related to her cancer extensively and collaborated history with the patient. Summary of oncologic history is as follows: Oncology History  Breast cancer (HCC)  07/15/2022 Initial Diagnosis   Patient noticed a palpable lump  in the right upper outer quadrant of the breast.  Last mammogram was in 2007.   07/27/2022 Mammogram   Diagnostic mammogram and US  IMPRESSION: 1. Highly suspicious mass in the RIGHT breast at the 11 o'clock axis, 3 cm from the nipple, measuring 1.9 cm, corresponding to the patient's palpable area of concern. Ultrasound-guided biopsy is recommended. 2. Several additional hypoechoic areas adjacent to the dominant RIGHT breast mass, suspected satellite masses, most peripheral component located at the 10 o'clock axis, 5 cm from the nipple, measuring 1 cm. Ultrasound-guided biopsy is recommended this most peripheral hypoechoic area/satellite mass. 3. No evidence of malignancy within the LEFT breast.   08/13/2022 Pathology Results   DIAGNOSIS: A.  BREAST, RIGHT 10:00 5 CMFN (HEART); ULTRASOUND-GUIDED BIOPSY: - INVASIVE MAMMARY CARCINOMA, NO SPECIAL TYPE. Size of invasive carcinoma: 6 mm in this sample Histologic grade of invasive carcinoma: Grade 1                      Glandular/tubular differentiation score: 2                      Nuclear pleomorphism score: 2  Mitotic rate score: 1                      Total score: 5 Ductal carcinoma in situ: Not identified Lymphovascular invasion: Not identified ER/PR/HER2: Immunohistochemistry will be performed on block A1, with reflex to FISH for HER2 2+. The results will be reported in an addendum.  B.  BREAST, RIGHT 11:00 3 CMFN (VENUS); ULTRASOUND-GUIDED BIOPSY: - INVASIVE MAMMARY CARCINOMA, NO SPECIAL TYPE, INVOLVING ORGANIZING FAT NECROSIS. Size of invasive carcinoma: 12 mm in this sample Histologic grade of invasive carcinoma: Grade 2                      Glandular/tubular differentiation score: 3                      Nuclear pleomorphism score: 2                      Mitotic rate score: 1                      Total score: 6 Ductal carcinoma in situ: Not identified Lymphovascular invasion: Not identified    ADDENDUM:  CASE SUMMARY: BREAST BIOMARKER TESTS - PART A: RIGHT 10:00 5 CMFN  (HEART)  Estrogen Receptor (ER) Status: POSITIVE          Percentage of cells with nuclear positivity: Greater than 90%          Average intensity of staining: Strong   Progesterone Receptor (PgR) Status: POSITIVE          Percentage of cells with nuclear positivity: 51-90%          Average intensity of staining: Moderate   HER2 (by immunohistochemistry): NEGATIVE (Score 0)   Ki-67: Not performed   CASE SUMMARY: BREAST BIOMARKER TESTS - PART B: RIGHT 11:00 3 CMFN (VENUS) Estrogen Receptor (ER) Status: POSITIVE         Percentage of cells with nuclear positivity: Greater than 90%         Average intensity of staining: Strong  Progesterone Receptor (PgR) Status: POSITIVE         Percentage of cells with nuclear positivity: 11-50%         Average intensity of staining: Moderate  HER2 (by immunohistochemistry): NEGATIVE (Score 0)    08/27/2022 Definitive Surgery   Right mastectomy with SLNB by Dr. Maia Plan  Pathology showed 2.8 cm invasive mammary cancer upper outer quadrant, grade 2, 2 smaller foci in the lower outer quadrant measuring 5 mm and 2.5 mm grade 2, margins negative, 1/4 lymph node positive for macrometastatic disease 3.2 mm, ENE negative, no LVI, pathologic stage pT2 N1a     08/27/2022 Oncotype testing   Oncotype DX recurrence score 5.  No benefit from chemotherapy.    Genetic Testing   Negative genetic testing. No pathogenic variants identified on the [facility] Common Hereditary Cancers+RNA panel. The report date is 09/27/2022.  The Common Hereditary Cancers Panel + RNA offered by [facility] includes sequencing and/or deletion duplication testing of the following 48 genes: APC*, ATM*, AXIN2, BAP1, BARD1, BMPR1A, BRCA1, BRCA2, BRIP1, CDH1, CDK4, CDKN2A (p14ARF), CDKN2A (p16INK4a), CHEK2, CTNNA1, DICER1*, EPCAM*, FH*, GREM1*, HOXB13, KIT, MBD4, MEN1*, MLH1*, MSH2*, MSH3*, MSH6*, MUTYH, NF1*,  NTHL1, PALB2, PDGFRA, PMS2*, POLD1*, POLE, PTEN*, RAD51C, RAD51D, SDHA*, SDHB, SDHC*, SDHD, SMAD4, SMARCA4, STK11, TP53, TSC1*, TSC2, VHL.     Menarche age 73 Age at first birth 59  Birth control yes in 48s Menopause age 8 HRT no  MEDICAL HISTORY:  Past Medical History:  Diagnosis Date   Acid reflux    Anxiety    Asthma    Breast cancer (HCC) 08/2022   right   Depression    Fibromyalgia    Hypertension, essential     SURGICAL HISTORY: Past Surgical History:  Procedure Laterality Date   BREAST BIOPSY Right    2006? neg   BREAST BIOPSY Right 08/13/2022   rt br u/s bx 11:00  venus clip path pending   BREAST BIOPSY Right 08/13/2022   [place] RT BREAST BX W LOC DEV 1ST LESION IMG BX SPEC US GUIDE 08/13/2022 [facility]   BREAST BIOPSY Right 08/13/2022   [place] RT BREAST BX W LOC DEV EA ADD LESION IMG BX SPEC US GUIDE 08/13/2022 [facility]   COLONOSCOPY     COLONOSCOPY WITH PROPOFOL N/A 02/01/2023   Procedure: COLONOSCOPY WITH PROPOFOL;  Surgeon: Jaynie Collins, DO;  Location: [facility] ENDOSCOPY;  Service: Gastroenterology;  Laterality: N/A;   CYST EXCISION Right    arm   DIRECT LARYNGOSCOPY  11/14/2013   ESOPHAGOGASTRODUODENOSCOPY (EGD) WITH PROPOFOL N/A 02/01/2023   Procedure: ESOPHAGOGASTRODUODENOSCOPY (EGD) WITH PROPOFOL;  Surgeon: Jaynie Collins, DO;  Location: [facility] ENDOSCOPY;  Service: Gastroenterology;  Laterality: N/A;   MASTECTOMY W/ SENTINEL NODE BIOPSY Right 08/27/2022   Procedure: MASTECTOMY WITH SENTINEL LYMPH NODE BIOPSY;  Surgeon: Carolan Shiver, MD;  Location: [facility] ORS;  Service: General;  Laterality: Right;   TUBAL LIGATION  1992    SOCIAL HISTORY: Social History   Socioeconomic History   Marital status: Single    Spouse name: Not on file   Number of children: Not on file   Years of education: Not on file   Highest education level: Not on file  Occupational History   Not on file  Tobacco Use   Smoking status: Every Day     Packs/day: .5    Types: Cigarettes   Smokeless tobacco: Never  Vaping Use   Vaping Use: Never used  Substance and Sexual Activity   Alcohol use: Not Currently    Comment: social drinker   Drug use: Yes    Types: Marijuana   Sexual activity: Yes    Birth control/protection: Post-menopausal  Other Topics Concern   Not on file  Social History Narrative   Lives with fiance in a camper   Social Determinants of Health   Financial Resource Strain: High Risk (08/19/2022)   Overall Financial Resource Strain (CARDIA)    Difficulty of Paying Living Expenses: Hard  Food Insecurity: No Food Insecurity (08/28/2022)   Hunger Vital Sign    Worried About Running Out of Food in the Last Year: Never true    Ran Out of Food in the Last Year: Never true  Recent Concern: Food Insecurity - Food Insecurity Present (08/19/2022)   Hunger Vital Sign    Worried About Running Out of Food in the Last Year: Sometimes true    Ran Out of Food in the Last Year: Sometimes true  Transportation Needs: Unmet Transportation Needs (08/28/2022)   PRAPARE - Transportation    Lack of Transportation (Medical): Yes    Lack of Transportation (Non-Medical): Yes  Physical Activity: Inactive (08/19/2022)   Exercise Vital Sign    Days of Exercise per Week: 0 days    Minutes of Exercise per Session: 0 min  Stress: Stress Concern Present (08/19/2022)   [employer] of Occupational Health - Occupational Stress Questionnaire  Feeling of Stress : To some extent  Social Connections: Moderately Isolated (08/19/2022)   Social Connection and Isolation Panel [NHANES]    Frequency of Communication with Friends and Family: Three times a week    Frequency of Social Gatherings with Friends and Family: Once a week    Attends Religious Services: Never    Database administrator or Organizations: No    Attends Banker Meetings: Never    Marital Status: Living with partner  Intimate Partner Violence: Not At Risk  (08/28/2022)   Humiliation, Afraid, Rape, and Kick questionnaire    Fear of Current or Ex-Partner: No    Emotionally Abused: No    Physically Abused: No    Sexually Abused: No    FAMILY HISTORY: Family History  Problem Relation Age of Onset   Colon cancer Mother 59   Ovarian cancer Mother 60   Cancer Brother        unk type   Ovarian cancer Maternal Grandmother    Esophageal cancer Half-Brother     ALLERGIES:  is allergic to iodinated contrast media and penicillins.  MEDICATIONS:  Current Outpatient Medications  Medication Sig Dispense Refill   albuterol (VENTOLIN HFA) 108 (90 Base) MCG/ACT inhaler Inhale 2 puffs into the lungs every 6 (six) hours as needed.     atorvastatin (LIPITOR) 20 MG tablet Take 20 mg by mouth daily.     buPROPion (WELLBUTRIN XL) 150 MG 24 hr tablet Take 150 mg by mouth every morning.     clonazePAM (KLONOPIN) 1 MG tablet Take 1 mg by mouth 2 (two) times daily as needed for anxiety.     Docusate Sodium (DSS) 100 MG CAPS Take 1 capsule by mouth 2 (two) times daily.     exemestane (AROMASIN) 25 MG tablet Take 1 tablet (25 mg total) by mouth daily after breakfast. 30 tablet 2   famotidine (PEPCID) 20 MG tablet Take 20 mg by mouth daily.     gabapentin (NEURONTIN) 400 MG capsule Take 400 mg by mouth 3 (three) times daily.     lisinopril-hydrochlorothiazide (ZESTORETIC) 20-12.5 MG tablet Take 1 tablet by mouth daily. 30 tablet 1   oxyCODONE-acetaminophen (PERCOCET) 5-325 MG tablet Take 1 tablet by mouth every 4 (four) hours as needed for severe pain. 20 tablet 0   pantoprazole (PROTONIX) 40 MG tablet Take 1 tablet (40 mg total) by mouth 2 (two) times daily. 60 tablet 1   QUEtiapine (SEROQUEL) 50 MG tablet Take 50 mg by mouth at bedtime.     sertraline (ZOLOFT) 100 MG tablet Take 150 mg by mouth daily.     doxycycline (VIBRA-TABS) 100 MG tablet Take 100 mg by mouth 2 (two) times daily. (Patient not taking: Reported on 01/15/2023)     escitalopram (LEXAPRO) 20 MG  tablet Take 20 mg by mouth daily. (Patient not taking: Reported on 08/27/2022)     ibuprofen (ADVIL) 200 MG tablet Take 600 mg by mouth daily as needed. (Patient not taking: Reported on 12/25/2022)     methocarbamol (ROBAXIN) 500 MG tablet Take 1 tablet (500 mg total) by mouth 4 (four) times daily. (Patient not taking: Reported on 01/15/2023) 15 tablet 0   SUMAtriptan (IMITREX) 50 MG tablet Take 1 tablet (50 mg total) by mouth once for 1 dose. May repeat in 2 hours if headache persists or recurs. (Patient not taking: Reported on 01/15/2023) 10 tablet 0   No current facility-administered medications for this visit.    REVIEW OF SYSTEMS:  Pertinent information mentioned in HPI All other systems were reviewed with the patient and are negative.  PHYSICAL EXAMINATION: ECOG PERFORMANCE STATUS: 0 - Asymptomatic  Vitals:   02/19/23 1003  BP: 128/74  Pulse: 72  Temp: (!) 97.1 F (36.2 C)  SpO2: 98%      Filed Weights   02/19/23 1003  Weight: 195 lb 8 oz (88.7 kg)       GENERAL:alert, no distress and comfortable SKIN: skin color, texture, turgor are normal, no rashes or significant lesions EYES: normal, conjunctiva are pink and non-injected, sclera clear OROPHARYNX:no exudate, no erythema and lips, buccal mucosa, and tongue normal  NECK: supple, thyroid normal size, non-tender, without nodularity LYMPH:  no palpable lymphadenopathy in the cervical, axillary or inguinal LUNGS: clear to auscultation and percussion with normal breathing effort HEART: regular rate & rhythm and no murmurs and no lower extremity edema ABDOMEN:abdomen soft, non-tender and normal bowel sounds Musculoskeletal:no cyanosis of digits and no clubbing  PSYCH: alert & oriented x 3 with fluent speech NEURO: no focal motor/sensory deficits  Right breat-suture site healing well.  On the medial aspect slightly open but no signs of infection noted.  Bruising in the right axilla. Left breast, small movable pea-sized  palpable tender area in the left breast in the lower outer quadrant.   LABORATORY DATA:  I have reviewed the data as listed Lab Results  Component Value Date   WBC 9.0 02/19/2023   HGB 14.1 02/19/2023   HCT 44.1 02/19/2023   MCV 84.2 02/19/2023   PLT 196 02/19/2023   Recent Labs    08/26/22 1111 08/28/22 0644 02/19/23 1038  NA 138 137 141  K 4.1 4.2 4.2  CL 106 106 104  CO2 28 24 29   GLUCOSE 92 118* 110*  BUN 18 19 16   CREATININE 0.74 0.78 0.91  CALCIUM 8.8* 8.6* 9.3  GFRNONAA >60 >60 >60  PROT  --   --  7.7  ALBUMIN  --   --  3.9  AST  --   --  21  ALT  --   --  12  ALKPHOS  --   --  83  BILITOT  --   --  0.5    RADIOGRAPHIC STUDIES: I have personally reviewed the radiological images as listed and agreed with the findings in the report. No results found.

## 2023-02-19 NOTE — Progress Notes (Signed)
Patient says that the more recent medication that Dr. Alena Bills prescribed her is helping a lot. She is having some pain all over but mostly in her stomach, which she rates that pain at about an 8.

## 2023-02-22 ENCOUNTER — Inpatient Hospital Stay: Payer: BLUE CROSS/BLUE SHIELD

## 2023-02-22 NOTE — Progress Notes (Signed)
[facility] CSW Progress Note  Clinical Child psychotherapist contacted patient by phone to assess needs.  She requested assistance with housing.  She is currently living in an RV.  Provided information on  boarding houses and shelters.  Patient declined these options.  [name], LCSW, previously provided her with housing information.  Her disability application is currently being processed per the [facility].  Her boyfriend, Will, is very supportive and helps her with transportation to appointments.  Patient sees a psychiatrist who manages her medication.  Patient received the [employer] in February.  CSW provided active listening and supportive counseling.    Dorothey Baseman, LCSW Clinical Social Worker [facility]    Patient is participating in a Managed Medicaid Plan:  Yes

## 2023-03-02 ENCOUNTER — Encounter: Payer: Self-pay | Admitting: Gastroenterology

## 2023-03-03 ENCOUNTER — Inpatient Hospital Stay: Admission: RE | Admit: 2023-03-03 | Payer: Disability Insurance | Source: Ambulatory Visit

## 2023-03-09 ENCOUNTER — Ambulatory Visit
Admission: RE | Admit: 2023-03-09 | Discharge: 2023-03-09 | Disposition: A | Discharge status: Home / Self Care | Payer: BLUE CROSS/BLUE SHIELD | Source: Ambulatory Visit | Attending: Gastroenterology | Admitting: Gastroenterology

## 2023-03-09 DIAGNOSIS — R198 Other specified symptoms and signs involving the digestive system and abdomen: Secondary | ICD-10-CM | POA: Diagnosis present

## 2023-03-09 MED ORDER — IOHEXOL 300 MG/ML  SOLN
100.0000 mL | Freq: Once | INTRAMUSCULAR | Status: AC | PRN
  Administered 2023-03-09: 100 mL via INTRAVENOUS

## 2023-04-20 ENCOUNTER — Telehealth: Payer: Self-pay | Admitting: *Deleted

## 2023-04-20 MED ORDER — EXEMESTANE 25 MG PO TABS: 25.0000 mg | ORAL_TABLET | Freq: Every day | ORAL | 2 refills | Status: DC

## 2023-04-20 NOTE — Telephone Encounter (Signed)
Pt requesting refills on aromasin 25 mg daily.  Pt does not have any refills left and will take last pill on Friday 04/23/23. Pt uses [facility] [address] in [place].

## 2023-06-02 ENCOUNTER — Telehealth: Payer: Self-pay

## 2023-06-02 NOTE — Telephone Encounter (Signed)
LVM for patient to call back [phone], or to call PCP office to schedule follow up apt. AS, CMA  

## 2023-07-08 ENCOUNTER — Other Ambulatory Visit: Payer: Self-pay | Admitting: General Surgery

## 2023-07-08 DIAGNOSIS — Z1231 Encounter for screening mammogram for malignant neoplasm of breast: Secondary | ICD-10-CM

## 2023-07-18 ENCOUNTER — Other Ambulatory Visit: Payer: Self-pay | Admitting: Internal Medicine

## 2023-07-22 ENCOUNTER — Inpatient Hospital Stay: Payer: BLUE CROSS/BLUE SHIELD

## 2023-07-22 ENCOUNTER — Inpatient Hospital Stay: Payer: BLUE CROSS/BLUE SHIELD | Admitting: Internal Medicine

## 2023-07-23 ENCOUNTER — Inpatient Hospital Stay (HOSPITAL_BASED_OUTPATIENT_CLINIC_OR_DEPARTMENT_OTHER): Payer: BLUE CROSS/BLUE SHIELD | Admitting: Internal Medicine

## 2023-07-23 ENCOUNTER — Inpatient Hospital Stay: Payer: BLUE CROSS/BLUE SHIELD | Attending: Internal Medicine

## 2023-07-23 VITALS — BP 148/68 | HR 77 | Temp 97.2°F | Wt 205.0 lb

## 2023-07-23 DIAGNOSIS — M79601 Pain in right arm: Secondary | ICD-10-CM

## 2023-07-23 DIAGNOSIS — F17210 Nicotine dependence, cigarettes, uncomplicated: Secondary | ICD-10-CM | POA: Insufficient documentation

## 2023-07-23 DIAGNOSIS — Z800 Family history of malignant neoplasm of digestive organs: Secondary | ICD-10-CM | POA: Insufficient documentation

## 2023-07-23 DIAGNOSIS — F419 Anxiety disorder, unspecified: Secondary | ICD-10-CM

## 2023-07-23 DIAGNOSIS — Z170 Estrogen receptor positive status [ER+]: Secondary | ICD-10-CM

## 2023-07-23 DIAGNOSIS — M8588 Other specified disorders of bone density and structure, other site: Secondary | ICD-10-CM | POA: Diagnosis not present

## 2023-07-23 DIAGNOSIS — R232 Flushing: Secondary | ICD-10-CM

## 2023-07-23 DIAGNOSIS — Z1721 Progesterone receptor positive status: Secondary | ICD-10-CM | POA: Diagnosis not present

## 2023-07-23 DIAGNOSIS — C50911 Malignant neoplasm of unspecified site of right female breast: Secondary | ICD-10-CM

## 2023-07-23 DIAGNOSIS — Z1732 Human epidermal growth factor receptor 2 negative status: Secondary | ICD-10-CM | POA: Insufficient documentation

## 2023-07-23 DIAGNOSIS — Z79811 Long term (current) use of aromatase inhibitors: Secondary | ICD-10-CM

## 2023-07-23 DIAGNOSIS — C50411 Malignant neoplasm of upper-outer quadrant of right female breast: Secondary | ICD-10-CM

## 2023-07-23 DIAGNOSIS — Z8049 Family history of malignant neoplasm of other genital organs: Secondary | ICD-10-CM | POA: Diagnosis not present

## 2023-07-23 DIAGNOSIS — Z8041 Family history of malignant neoplasm of ovary: Secondary | ICD-10-CM

## 2023-07-23 DIAGNOSIS — R112 Nausea with vomiting, unspecified: Secondary | ICD-10-CM

## 2023-07-23 DIAGNOSIS — T451X5A Adverse effect of antineoplastic and immunosuppressive drugs, initial encounter: Secondary | ICD-10-CM

## 2023-07-23 DIAGNOSIS — D508 Other iron deficiency anemias: Secondary | ICD-10-CM

## 2023-07-23 DIAGNOSIS — D509 Iron deficiency anemia, unspecified: Secondary | ICD-10-CM | POA: Diagnosis not present

## 2023-07-23 LAB — COMPREHENSIVE METABOLIC PANEL
ALT: 14 U/L (ref 0–44)
AST: 16 U/L (ref 15–41)
Albumin: 3.5 g/dL (ref 3.5–5.0)
Alkaline Phosphatase: 75 U/L (ref 38–126)
Anion gap: 5 (ref 5–15)
BUN: 28 mg/dL — ABNORMAL HIGH (ref 6–20)
CO2: 27 mmol/L (ref 22–32)
Calcium: 8.5 mg/dL — ABNORMAL LOW (ref 8.9–10.3)
Chloride: 106 mmol/L (ref 98–111)
Creatinine, Ser: 1.24 mg/dL — ABNORMAL HIGH (ref 0.44–1.00)
GFR, Estimated: 50 mL/min — ABNORMAL LOW (ref >60)
Glucose, Bld: 104 mg/dL — ABNORMAL HIGH (ref 70–99)
Potassium: 4.1 mmol/L (ref 3.5–5.1)
Sodium: 138 mmol/L (ref 135–145)
Total Bilirubin: 0.5 mg/dL (ref <1.2)
Total Protein: 6.6 g/dL (ref 6.5–8.1)

## 2023-07-23 LAB — CBC WITH DIFFERENTIAL/PLATELET
Abs Immature Granulocytes: 0.05 10*3/uL (ref 0.00–0.07)
Basophils Absolute: 0.1 10*3/uL (ref 0.0–0.1)
Basophils Relative: 1 %
Eosinophils Absolute: 0.1 10*3/uL (ref 0.0–0.5)
Eosinophils Relative: 2 %
HCT: 41.3 % (ref 36.0–46.0)
Hemoglobin: 13.2 g/dL (ref 12.0–15.0)
Immature Granulocytes: 1 %
Lymphocytes Relative: 23 %
Lymphs Abs: 1.8 10*3/uL (ref 0.7–4.0)
MCH: 29.1 pg (ref 26.0–34.0)
MCHC: 32 g/dL (ref 30.0–36.0)
MCV: 91.2 fL (ref 80.0–100.0)
Monocytes Absolute: 0.6 10*3/uL (ref 0.1–1.0)
Monocytes Relative: 8 %
Neutro Abs: 5.1 10*3/uL (ref 1.7–7.7)
Neutrophils Relative %: 65 %
Platelets: 189 10*3/uL (ref 150–400)
RBC: 4.53 MIL/uL (ref 3.87–5.11)
RDW: 13.7 % (ref 11.5–15.5)
WBC: 7.7 10*3/uL (ref 4.0–10.5)
nRBC: 0 % (ref 0.0–0.2)

## 2023-07-23 MED ORDER — ONDANSETRON HCL 4 MG PO TABS: 4.0000 mg | ORAL_TABLET | Freq: Three times a day (TID) | ORAL | 0 refills | Status: AC | PRN

## 2023-07-23 NOTE — Progress Notes (Signed)
[facility] CONSULT NOTE  Patient Care Team: Michaelyn Barter, MD as PCP - General (Oncology) Hulen Luster, RN as Oncology Nurse Navigator Carolan Shiver, MD as Consulting Physician (General Surgery)   CANCER STAGING   Cancer Staging  Breast cancer [facility]) Staging form: Breast, AJCC 8th Edition - Pathologic stage from 08/27/2022: Stage IB (pT2, pN1a(sn), cM0, G2, ER+, PR+, HER2-, Oncotype DX score: 5) - Signed by Michaelyn Barter, MD on 09/17/2022 Stage prefix: Initial diagnosis Method of lymph node assessment: Sentinel lymph node biopsy Multigene prognostic tests performed: Oncotype DX Recurrence score range: Less than 11 Histologic grading system: 3 grade system   ASSESSMENT & PLAN:  Angela Deleon 62 y.o. female with pmh of fibromyalgia, hypertension and depression was seen in medical oncology to discuss treatment for right-sided breast cancer.  # Right breast invasive ductal cancer, pathologic stage Ib (pT2N1) ER/PR+, HER2- -self palpable. S/p US guided biopsy on 08/13/2022.  -s/p right-sided mastectomy with SLNB by Dr. Maia Plan on 08/27/2022.  Pathology showed 2.8 cm invasive mammary cancer upper outer quadrant, grade 2, 2 smaller foci in the lower outer quadrant measuring 5 mm and 2.5 mm grade 2, margins negative, 1/4 lymph node positive for macrometastatic disease 3.2 mm, ENE negative, no LVI, pathologic stage pT2 N1a   -Oncotype DX testing showed low recurrence score of 5.  So there is no benefit of chemotherapy.  Was seen by Dr. Rushie Chestnut and no role for radiation.  -DEXA scan from 12/30/2022 showed normal density in the spine, osteopenia in hip.  Continue with calcium vitamin D supplements.  -Patient started letrozole on 11/13/2022.  Discontinued on 12/25/2022 due to GI symptoms such as nausea, vomiting, worsening fatigue, myalgias, arthralgias   - Switched to exemestane 25 mg on 01/15/2023.  Patient continues to tolerate exemestane better.  She had hot flashes prior  to starting endocrine therapy which is worse now.  I did discuss about role of Effexor XR for management of hot flashes.  Patient follows with psychiatry and is on multiple medicines including SSRIs.  I advised the patient to discuss with her psychiatrist if Effexor can be added.  -Scheduled for mammogram on 07/29/2023.  She has follow-up scheduled with Dr. Maia Plan after that.  # Iron deficiency anemia # Nausea vomiting -Iron deficiency anemia resolved after iron supplements.  Hemoglobin is normalized.  Advised the patient to stop iron.  -s/p colonoscopy with Dr. Timothy Lasso on 02/01/2023 which showed diverticulosis in entire colon, nonbleeding internal hemorrhoids and multiple polyps which were removed.  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  She was scheduled to get repeat endoscopy in November 2024 but has change in insurance.  Now she has been advised by Dr. Ferd Hibbs office to establish care with [facility] which falls in her network.  Patient is aware.  # Right arm pain -Present since surgery.  Neuropathic -Continues to be an issue.  Is on gabapentin 400 mg twice a day.  Also on Cymbalta 60 mg daily.  Having some issues with the [facility].  # Family history of colon, uterine and ovarian cancer -[facility] gene panel from 09/29/2022 negative.  # Anxiety -Patient follows with [facility].  She is on multiple medications-Klonopin, Lexapro, Wellbutrin, Seroquel.   Orders Placed This Encounter  Procedures   CMP (Cancer Center only)    Standing Status:   Future    Standing Expiration Date:   07/22/2024   CBC with Differential (  Cancer Center Only)    Standing Status:   Future    Standing Expiration Date:   07/22/2024   RTC in 6 months for MD visit, labs, exemestane toxicity check.  The total time spent in the appointment was 30 minutes encounter with patients including  review of chart and various tests results, discussions about plan of care and coordination of care plan   All questions were answered. The patient knows to call the clinic with any problems, questions or concerns. No barriers to learning was detected.  Michaelyn Barter, MD 11/8/20242:33 PM   HISTORY OF PRESENTING ILLNESS:  Angela Deleon 62 y.o. female with pmh of fibromyalgia, hypertension and depression was seen in medical oncology to discuss treatment for right-sided breast cancer.  Interval history Patient was seen today accompanied by husband for follow-up as toxicity check for Exemestane  Her major concern today was the neuropathic pain she is having in her right breast around the surgical site.  She is been taking gabapentin and Cymbalta with minimal improvement.  Has intermittent nausea much improved than prior.  Requesting for antiemetics to be refilled.  Having hot flashes worsened by starting endocrine therapy.  I have reviewed her chart and materials related to her cancer extensively and collaborated history with the patient. Summary of oncologic history is as follows: Oncology History  Breast cancer (HCC)  07/15/2022 Initial Diagnosis   Patient noticed a palpable lump in the right upper outer quadrant of the breast.  Last mammogram was in 2007.   07/27/2022 Mammogram   Diagnostic mammogram and US  IMPRESSION: 1. Highly suspicious mass in the RIGHT breast at the 11 o'clock axis, 3 cm from the nipple, measuring 1.9 cm, corresponding to the patient's palpable area of concern. Ultrasound-guided biopsy is recommended. 2. Several additional hypoechoic areas adjacent to the dominant RIGHT breast mass, suspected satellite masses, most peripheral component located at the 10 o'clock axis, 5 cm from the nipple, measuring 1 cm. Ultrasound-guided biopsy is recommended this most peripheral hypoechoic area/satellite mass. 3. No evidence of malignancy within the LEFT breast.   08/13/2022  Pathology Results   DIAGNOSIS: A.  BREAST, RIGHT 10:00 5 CMFN (HEART); ULTRASOUND-GUIDED BIOPSY: - INVASIVE MAMMARY CARCINOMA, NO SPECIAL TYPE. Size of invasive carcinoma: 6 mm in this sample Histologic grade of invasive carcinoma: Grade 1                      Glandular/tubular differentiation score: 2                      Nuclear pleomorphism score: 2                      Mitotic rate score: 1                      Total score: 5 Ductal carcinoma in situ: Not identified Lymphovascular invasion: Not identified ER/PR/HER2: Immunohistochemistry will be performed on block A1, with reflex to FISH for HER2 2+. The results will be reported in an addendum.  B.  BREAST, RIGHT 11:00 3 CMFN (VENUS); ULTRASOUND-GUIDED BIOPSY: - INVASIVE MAMMARY CARCINOMA, NO SPECIAL TYPE, INVOLVING ORGANIZING FAT NECROSIS. Size of invasive carcinoma: 12 mm in this sample Histologic grade of invasive carcinoma: Grade 2                      Glandular/tubular differentiation score: 3  Nuclear pleomorphism score: 2                      Mitotic rate score: 1                      Total score: 6 Ductal carcinoma in situ: Not identified Lymphovascular invasion: Not identified   ADDENDUM:  CASE SUMMARY: BREAST BIOMARKER TESTS - PART A: RIGHT 10:00 5 CMFN  (HEART)  Estrogen Receptor (ER) Status: POSITIVE          Percentage of cells with nuclear positivity: Greater than 90%          Average intensity of staining: Strong   Progesterone Receptor (PgR) Status: POSITIVE          Percentage of cells with nuclear positivity: 51-90%          Average intensity of staining: Moderate   HER2 (by immunohistochemistry): NEGATIVE (Score 0)   Ki-67: Not performed   CASE SUMMARY: BREAST BIOMARKER TESTS - PART B: RIGHT 11:00 3 CMFN (VENUS) Estrogen Receptor (ER) Status: POSITIVE         Percentage of cells with nuclear positivity: Greater than 90%         Average intensity of staining:  Strong  Progesterone Receptor (PgR) Status: POSITIVE         Percentage of cells with nuclear positivity: 11-50%         Average intensity of staining: Moderate  HER2 (by immunohistochemistry): NEGATIVE (Score 0)    08/27/2022 Definitive Surgery   Right mastectomy with SLNB by Dr. Maia Plan  Pathology showed 2.8 cm invasive mammary cancer upper outer quadrant, grade 2, 2 smaller foci in the lower outer quadrant measuring 5 mm and 2.5 mm grade 2, margins negative, 1/4 lymph node positive for macrometastatic disease 3.2 mm, ENE negative, no LVI, pathologic stage pT2 N1a     08/27/2022 Oncotype testing   Oncotype DX recurrence score 5.  No benefit from chemotherapy.    Genetic Testing   Negative genetic testing. No pathogenic variants identified on the [facility] Common Hereditary Cancers+RNA panel. The report date is 09/27/2022.  The Common Hereditary Cancers Panel + RNA offered by [facility] includes sequencing and/or deletion duplication testing of the following 48 genes: APC*, ATM*, AXIN2, BAP1, BARD1, BMPR1A, BRCA1, BRCA2, BRIP1, CDH1, CDK4, CDKN2A (p14ARF), CDKN2A (p16INK4a), CHEK2, CTNNA1, DICER1*, EPCAM*, FH*, GREM1*, HOXB13, KIT, MBD4, MEN1*, MLH1*, MSH2*, MSH3*, MSH6*, MUTYH, NF1*, NTHL1, PALB2, PDGFRA, PMS2*, POLD1*, POLE, PTEN*, RAD51C, RAD51D, SDHA*, SDHB, SDHC*, SDHD, SMAD4, SMARCA4, STK11, TP53, TSC1*, TSC2, VHL.     Menarche age 67 Age at first birth 63 Birth control yes in 65s Menopause age 89 HRT no  MEDICAL HISTORY:  Past Medical History:  Diagnosis Date   Acid reflux    Anxiety    Asthma    Breast cancer (HCC) 08/2022   right   Depression    Fibromyalgia    Hypertension, essential     SURGICAL HISTORY: Past Surgical History:  Procedure Laterality Date   BREAST BIOPSY Right    2006? neg   BREAST BIOPSY Right 08/13/2022   rt br u/s bx 11:00  venus clip path pending   BREAST BIOPSY Right 08/13/2022   [place] RT BREAST BX W LOC DEV 1ST LESION IMG BX SPEC US GUIDE  08/13/2022 [facility]   BREAST BIOPSY Right 08/13/2022   [place] RT BREAST BX W LOC DEV EA ADD LESION IMG BX SPEC US GUIDE 08/13/2022 [facility]  COLONOSCOPY     COLONOSCOPY WITH PROPOFOL N/A 02/01/2023   Procedure: COLONOSCOPY WITH PROPOFOL;  Surgeon: Jaynie Collins, DO;  Location: [facility] ENDOSCOPY;  Service: Gastroenterology;  Laterality: N/A;   CYST EXCISION Right    arm   DIRECT LARYNGOSCOPY  11/14/2013   ESOPHAGOGASTRODUODENOSCOPY (EGD) WITH PROPOFOL N/A 02/01/2023   Procedure: ESOPHAGOGASTRODUODENOSCOPY (EGD) WITH PROPOFOL;  Surgeon: Jaynie Collins, DO;  Location: [facility] ENDOSCOPY;  Service: Gastroenterology;  Laterality: N/A;   MASTECTOMY W/ SENTINEL NODE BIOPSY Right 08/27/2022   Procedure: MASTECTOMY WITH SENTINEL LYMPH NODE BIOPSY;  Surgeon: Carolan Shiver, MD;  Location: [facility] ORS;  Service: General;  Laterality: Right;   TUBAL LIGATION  1992    SOCIAL HISTORY: Social History   Socioeconomic History   Marital status: Single    Spouse name: Not on file   Number of children: Not on file   Years of education: Not on file   Highest education level: Not on file  Occupational History   Not on file  Tobacco Use   Smoking status: Every Day    Current packs/day: 0.50    Types: Cigarettes   Smokeless tobacco: Never  Vaping Use   Vaping status: Never Used  Substance and Sexual Activity   Alcohol use: Not Currently    Comment: social drinker   Drug use: Yes    Types: Marijuana   Sexual activity: Yes    Birth control/protection: Post-menopausal  Other Topics Concern   Not on file  Social History Narrative   Lives with fiance in a camper   Social Determinants of Health   Financial Resource Strain: High Risk (08/19/2022)   Overall Financial Resource Strain (CARDIA)    Difficulty of Paying Living Expenses: Hard  Food Insecurity: No Food Insecurity (07/19/2023)   Received from [facility]   Hunger Vital Sign    Worried About Running Out of Food  in the Last Year: Never true    Ran Out of Food in the Last Year: Never true  Transportation Needs: No Transportation Needs (07/19/2023)   Received from [facility]   PRAPARE - Transportation    Lack of Transportation (Medical): No    Lack of Transportation (Non-Medical): No  Physical Activity: Inactive (08/19/2022)   Exercise Vital Sign    Days of Exercise per Week: 0 days    Minutes of Exercise per Session: 0 min  Stress: Stress Concern Present (08/19/2022)   [employer] of Occupational Health - Occupational Stress Questionnaire    Feeling of Stress : To some extent  Social Connections: Moderately Isolated (08/19/2022)   Social Connection and Isolation Panel [NHANES]    Frequency of Communication with Friends and Family: Three times a week    Frequency of Social Gatherings with Friends and Family: Once a week    Attends Religious Services: Never    Database administrator or Organizations: No    Attends Banker Meetings: Never    Marital Status: Living with partner  Intimate Partner Violence: Not At Risk (08/28/2022)   Humiliation, Afraid, Rape, and Kick questionnaire    Fear of Current or Ex-Partner: No    Emotionally Abused: No    Physically Abused: No    Sexually Abused: No    FAMILY HISTORY: Family History  Problem Relation Age of Onset   Colon cancer Mother 32   Ovarian cancer Mother 87   Cancer Brother        unk type   Ovarian cancer Maternal Grandmother  Esophageal cancer Half-Brother     ALLERGIES:  is allergic to iodinated contrast media and penicillins.  MEDICATIONS:  Current Outpatient Medications  Medication Sig Dispense Refill   atorvastatin (LIPITOR) 20 MG tablet Take 20 mg by mouth daily.     buPROPion (WELLBUTRIN XL) 150 MG 24 hr tablet Take 150 mg by mouth every morning.     clonazePAM (KLONOPIN) 1 MG tablet Take 1 mg by mouth 2 (two) times daily as needed for anxiety.     Docusate Sodium (DSS) 100 MG CAPS Take 1 capsule by  mouth 2 (two) times daily.     exemestane (AROMASIN) 25 MG tablet TAKE 1 TABLET (25 MG TOTAL) BY MOUTH DAILY AFTER BREAKFAST. 90 tablet 3   famotidine (PEPCID) 20 MG tablet Take 20 mg by mouth daily.     gabapentin (NEURONTIN) 400 MG capsule Take 400 mg by mouth 3 (three) times daily.     lisinopril-hydrochlorothiazide (ZESTORETIC) 20-12.5 MG tablet Take 1 tablet by mouth daily. 30 tablet 1   ondansetron (ZOFRAN) 4 MG tablet Take 1 tablet (4 mg total) by mouth every 8 (eight) hours as needed for up to 60 doses for nausea or vomiting. 60 tablet 0   QUEtiapine (SEROQUEL) 50 MG tablet Take 50 mg by mouth at bedtime.     sertraline (ZOLOFT) 100 MG tablet Take 150 mg by mouth daily.     ibuprofen (ADVIL) 200 MG tablet Take 600 mg by mouth daily as needed. (Patient not taking: Reported on 12/25/2022)     methocarbamol (ROBAXIN) 500 MG tablet Take 1 tablet (500 mg total) by mouth 4 (four) times daily. (Patient not taking: Reported on 01/15/2023) 15 tablet 0   oxyCODONE-acetaminophen (PERCOCET) 5-325 MG tablet Take 1 tablet by mouth every 4 (four) hours as needed for severe pain. (Patient not taking: Reported on 07/23/2023) 20 tablet 0   pantoprazole (PROTONIX) 40 MG tablet Take 1 tablet (40 mg total) by mouth 2 (two) times daily. (Patient not taking: Reported on 07/23/2023) 60 tablet 1   SUMAtriptan (IMITREX) 50 MG tablet Take 1 tablet (50 mg total) by mouth once for 1 dose. May repeat in 2 hours if headache persists or recurs. (Patient not taking: Reported on 01/15/2023) 10 tablet 0   No current facility-administered medications for this visit.    REVIEW OF SYSTEMS:   Pertinent information mentioned in HPI All other systems were reviewed with the patient and are negative.  PHYSICAL EXAMINATION: ECOG PERFORMANCE STATUS: 0 - Asymptomatic  Vitals:   07/23/23 0948  BP: (!) 148/68  Pulse: 77  Temp: (!) 97.2 F (36.2 C)  SpO2: 93%      Filed Weights   07/23/23 0948  Weight: 205 lb (93 kg)        GENERAL:alert, no distress and comfortable SKIN: skin color, texture, turgor are normal, no rashes or significant lesions EYES: normal, conjunctiva are pink and non-injected, sclera clear OROPHARYNX:no exudate, no erythema and lips, buccal mucosa, and tongue normal  NECK: supple, thyroid normal size, non-tender, without nodularity LYMPH:  no palpable lymphadenopathy in the cervical, axillary or inguinal LUNGS: clear to auscultation and percussion with normal breathing effort HEART: regular rate & rhythm and no murmurs and no lower extremity edema ABDOMEN:abdomen soft, non-tender and normal bowel sounds Musculoskeletal:no cyanosis of digits and no clubbing  PSYCH: alert & oriented x 3 with fluent speech NEURO: no focal motor/sensory deficits  Right breat-suture site healing well.  On the medial aspect slightly open but no signs of  infection noted.  Bruising in the right axilla. Left breast, small movable pea-sized palpable tender area in the left breast in the lower outer quadrant.   LABORATORY DATA:  I have reviewed the data as listed Lab Results  Component Value Date   WBC 7.7 07/23/2023   HGB 13.2 07/23/2023   HCT 41.3 07/23/2023   MCV 91.2 07/23/2023   PLT 189 07/23/2023   Recent Labs    08/28/22 0644 02/19/23 1038 07/23/23 0928  NA 137 141 138  K 4.2 4.2 4.1  CL 106 104 106  CO2 24 29 27   GLUCOSE 118* 110* 104*  BUN 19 16 28*  CREATININE 0.78 0.91 1.24*  CALCIUM 8.6* 9.3 8.5*  GFRNONAA >60 >60 50*  PROT  --  7.7 6.6  ALBUMIN  --  3.9 3.5  AST  --  21 16  ALT  --  12 14  ALKPHOS  --  83 75  BILITOT  --  0.5 0.5    RADIOGRAPHIC STUDIES: I have personally reviewed the radiological images as listed and agreed with the findings in the report. No results found.

## 2023-07-23 NOTE — Patient Instructions (Addendum)
Stop iron pills  I have sent nausea medicine to your pharmacy  Please discuss with your pyschiatry about Effexor - it can help with hot flashes too.

## 2023-07-23 NOTE — Progress Notes (Signed)
Patient is having some nausea with vomiting, and she is out of her nausea medication. She is also having some shoulder pain which radiates through her right side through her incision, she rates her pain at about a 9.

## 2023-07-29 ENCOUNTER — Ambulatory Visit: Payer: BLUE CROSS/BLUE SHIELD

## 2023-08-05 ENCOUNTER — Ambulatory Visit: Admit: 2023-08-05 | Payer: BLUE CROSS/BLUE SHIELD | Admitting: Gastroenterology

## 2023-08-05 SURGERY — COLONOSCOPY WITH PROPOFOL
Anesthesia: General

## 2023-08-25 ENCOUNTER — Other Ambulatory Visit: Payer: Self-pay | Admitting: General Surgery

## 2023-08-25 ENCOUNTER — Ambulatory Visit
Admission: RE | Admit: 2023-08-25 | Discharge: 2023-08-25 | Disposition: A | Discharge status: Home / Self Care | Payer: BLUE CROSS/BLUE SHIELD | Source: Ambulatory Visit | Attending: General Surgery | Admitting: General Surgery

## 2023-08-25 DIAGNOSIS — Z1231 Encounter for screening mammogram for malignant neoplasm of breast: Secondary | ICD-10-CM

## 2023-10-16 ENCOUNTER — Other Ambulatory Visit: Payer: Self-pay | Admitting: Internal Medicine

## 2023-10-22 ENCOUNTER — Telehealth: Payer: Self-pay | Admitting: Internal Medicine

## 2023-10-22 NOTE — Telephone Encounter (Signed)
 Per email from [place], Dr.A will be out of the office when this pt had an appt scheduled 01/24/24. I spoke with pt and r/s the appts to be 1 week before. Pt aware.

## 2024-01-17 ENCOUNTER — Inpatient Hospital Stay (HOSPITAL_BASED_OUTPATIENT_CLINIC_OR_DEPARTMENT_OTHER): Payer: BLUE CROSS/BLUE SHIELD | Admitting: Internal Medicine

## 2024-01-17 ENCOUNTER — Encounter: Payer: Self-pay | Admitting: Internal Medicine

## 2024-01-17 ENCOUNTER — Inpatient Hospital Stay: Payer: BLUE CROSS/BLUE SHIELD | Attending: Internal Medicine

## 2024-01-17 VITALS — BP 139/70 | HR 61 | Temp 98.6°F | Resp 17 | Wt 196.8 lb

## 2024-01-17 DIAGNOSIS — Z8041 Family history of malignant neoplasm of ovary: Secondary | ICD-10-CM | POA: Diagnosis not present

## 2024-01-17 DIAGNOSIS — F17210 Nicotine dependence, cigarettes, uncomplicated: Secondary | ICD-10-CM | POA: Insufficient documentation

## 2024-01-17 DIAGNOSIS — Z170 Estrogen receptor positive status [ER+]: Secondary | ICD-10-CM | POA: Diagnosis not present

## 2024-01-17 DIAGNOSIS — Z1732 Human epidermal growth factor receptor 2 negative status: Secondary | ICD-10-CM | POA: Diagnosis not present

## 2024-01-17 DIAGNOSIS — Z8049 Family history of malignant neoplasm of other genital organs: Secondary | ICD-10-CM | POA: Insufficient documentation

## 2024-01-17 DIAGNOSIS — D509 Iron deficiency anemia, unspecified: Secondary | ICD-10-CM | POA: Insufficient documentation

## 2024-01-17 DIAGNOSIS — Z79899 Other long term (current) drug therapy: Secondary | ICD-10-CM | POA: Insufficient documentation

## 2024-01-17 DIAGNOSIS — M8589 Other specified disorders of bone density and structure, multiple sites: Secondary | ICD-10-CM | POA: Diagnosis not present

## 2024-01-17 DIAGNOSIS — Z79811 Long term (current) use of aromatase inhibitors: Secondary | ICD-10-CM | POA: Diagnosis not present

## 2024-01-17 DIAGNOSIS — C50411 Malignant neoplasm of upper-outer quadrant of right female breast: Secondary | ICD-10-CM | POA: Diagnosis present

## 2024-01-17 DIAGNOSIS — Z1721 Progesterone receptor positive status: Secondary | ICD-10-CM | POA: Diagnosis not present

## 2024-01-17 DIAGNOSIS — Z9011 Acquired absence of right breast and nipple: Secondary | ICD-10-CM | POA: Insufficient documentation

## 2024-01-17 DIAGNOSIS — C50911 Malignant neoplasm of unspecified site of right female breast: Secondary | ICD-10-CM | POA: Diagnosis not present

## 2024-01-17 DIAGNOSIS — M79601 Pain in right arm: Secondary | ICD-10-CM | POA: Insufficient documentation

## 2024-01-17 DIAGNOSIS — Z800 Family history of malignant neoplasm of digestive organs: Secondary | ICD-10-CM | POA: Insufficient documentation

## 2024-01-17 DIAGNOSIS — F419 Anxiety disorder, unspecified: Secondary | ICD-10-CM | POA: Insufficient documentation

## 2024-01-17 LAB — CBC WITH DIFFERENTIAL (CANCER CENTER ONLY)
Abs Immature Granulocytes: 0.03 10*3/uL (ref 0.00–0.07)
Basophils Absolute: 0.1 10*3/uL (ref 0.0–0.1)
Basophils Relative: 1 %
Eosinophils Absolute: 0.1 10*3/uL (ref 0.0–0.5)
Eosinophils Relative: 2 %
HCT: 40.5 % (ref 36.0–46.0)
Hemoglobin: 13.2 g/dL (ref 12.0–15.0)
Immature Granulocytes: 0 %
Lymphocytes Relative: 25 %
Lymphs Abs: 1.9 10*3/uL (ref 0.7–4.0)
MCH: 28.9 pg (ref 26.0–34.0)
MCHC: 32.6 g/dL (ref 30.0–36.0)
MCV: 88.8 fL (ref 80.0–100.0)
Monocytes Absolute: 0.6 10*3/uL (ref 0.1–1.0)
Monocytes Relative: 8 %
Neutro Abs: 4.9 10*3/uL (ref 1.7–7.7)
Neutrophils Relative %: 64 %
Platelet Count: 176 10*3/uL (ref 150–400)
RBC: 4.56 MIL/uL (ref 3.87–5.11)
RDW: 12.8 % (ref 11.5–15.5)
WBC Count: 7.6 10*3/uL (ref 4.0–10.5)
nRBC: 0 % (ref 0.0–0.2)

## 2024-01-17 LAB — CMP (CANCER CENTER ONLY)
ALT: 12 U/L (ref 0–44)
AST: 17 U/L (ref 15–41)
Albumin: 3.6 g/dL (ref 3.5–5.0)
Alkaline Phosphatase: 79 U/L (ref 38–126)
Anion gap: 11 (ref 5–15)
BUN: 18 mg/dL (ref 8–23)
CO2: 25 mmol/L (ref 22–32)
Calcium: 8.6 mg/dL — ABNORMAL LOW (ref 8.9–10.3)
Chloride: 101 mmol/L (ref 98–111)
Creatinine: 1.03 mg/dL — ABNORMAL HIGH (ref 0.44–1.00)
GFR, Estimated: >60 mL/min (ref >60)
Glucose, Bld: 108 mg/dL — ABNORMAL HIGH (ref 70–99)
Potassium: 3.4 mmol/L — ABNORMAL LOW (ref 3.5–5.1)
Sodium: 137 mmol/L (ref 135–145)
Total Bilirubin: 0.3 mg/dL (ref 0.0–1.2)
Total Protein: 7.1 g/dL (ref 6.5–8.1)

## 2024-01-17 MED ORDER — EXEMESTANE 25 MG PO TABS: 25.0000 mg | ORAL_TABLET | Freq: Every day | ORAL | 2 refills | Status: AC

## 2024-01-17 NOTE — Progress Notes (Signed)
 [facility] CONSULT NOTE  Patient Care Team: Dontay Harm, MD as PCP - General (Oncology) Waverly Hageman, RN as Oncology Nurse Navigator Eldred Grego, MD as Consulting Physician (General Surgery)   CANCER STAGING   Cancer Staging  Breast cancer [facility]) Staging form: Breast, AJCC 8th Edition - Pathologic stage from 08/27/2022: Stage IB (pT2, pN1a(sn), cM0, G2, ER+, PR+, HER2-, Oncotype DX score: 5) - Signed by Niya Behler, MD on 09/17/2022 Stage prefix: Initial diagnosis Method of lymph node assessment: Sentinel lymph node biopsy Multigene prognostic tests performed: Oncotype DX Recurrence score range: Less than 11 Histologic grading system: 3 grade system   ASSESSMENT & PLAN:  Angela Deleon 63 y.o. female with pmh of fibromyalgia, hypertension and depression was seen in medical oncology to discuss treatment for right-sided breast cancer.  # Right breast invasive ductal cancer, pathologic stage Ib (pT2N1) ER/PR+, HER2- -self palpable. S/p US  guided biopsy on 08/13/2022.  -s/p right-sided mastectomy with SLNB by Dr. Charmel Cooter on 08/27/2022.  Pathology showed 2.8 cm invasive mammary cancer upper outer quadrant, grade 2, 2 smaller foci in the lower outer quadrant measuring 5 mm and 2.5 mm grade 2, margins negative, 1/4 lymph node positive for macrometastatic disease 3.2 mm, ENE negative, no LVI, pathologic stage pT2 N1a   -Oncotype DX testing showed low recurrence score of 5.  No benefit from chemotherapy.  Was seen by Dr. Jacalyn Martin and no role for radiation.  -Patient started letrozole  on 11/13/2022.  Discontinued on 12/25/2022 due to GI symptoms such as nausea, vomiting, worsening fatigue, myalgias, arthralgias   -Switched to exemestane  25 mg on 01/15/2023.  Tolerating well.  Plan for 5 years.    - Left breast screening mammogram from December 2024 was negative.  Will schedule for December 2025.  --DEXA scan from 12/30/2022 showed normal density in the spine,  osteopenia in hip.  Continue with calcium vitamin D supplements.  Repeat DEXA due in April 2026.  # Iron deficiency anemia # Nausea vomiting - Resolved with iron supplements.  -s/p colonoscopy with Dr. Mamie Searles on 02/01/2023 which showed diverticulosis in entire colon, nonbleeding internal hemorrhoids and multiple polyps which were removed.  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  CT abdomen from 03/09/2023 showed no evidence of gastric fistula on the exam. She was scheduled to get repeat endoscopy in November 2024 but had change in insurance. She has been advised by Dr. Bethene Brought office to establish care with [facility] which falls in the network.  I rediscussed with her about recommendations.  She has follow-up with PCP next week which is with [facility] system and advised to request for referral to [facility].  # Right arm pain -Present since surgery.  Neuropathic -Continue with gabapentin .  # Family history of colon, uterine and ovarian cancer -[facility] gene panel from 09/29/2022 negative.  # Anxiety -Patient follows with [place] [facility], Dr. Sheria Dills Su.   -She is on multiple medications-Klonopin , Wellbutrin, Seroquel , Zoloft .  States she was recently started on new medication by Dr. Juel Nutley and since then has been having increasing hot flashes, memory issues.  She has follow-up scheduled soon and I have advised to discuss this further.  Hot flashes could be coming from exemestane  also, however she states they all started after an antianxiety medication dose was increased.  She does not remember the name.  We did discuss that there would be an option to switch Zoloft  to Celexa which  could potentially help with her mental centimeter induced hot flashes.   Orders Placed This Encounter  Procedures   MM 3D SCREENING MAMMOGRAM UNILATERAL LEFT BREAST    Standing Status:   Future    Expected  Date:   08/28/2024    Expiration Date:   01/16/2025    Reason for Exam (SYMPTOM  OR DIAGNOSIS REQUIRED):   history of right breast cancer; patient needs screening on left breast    Preferred imaging location?:   [facility]   CMP (Cancer Center only)    Standing Status:   Future    Expected Date:   09/01/2024    Expiration Date:   01/16/2025   CBC with Differential (Cancer Center Only)    Standing Status:   Future    Expected Date:   09/01/2024    Expiration Date:   01/16/2025   RTC in 6 months for MD visit, labs, exemestane  toxicity check, discuss mammogram.  The total time spent in the appointment was 30 minutes encounter with patients including review of chart and various tests results, discussions about plan of care and coordination of care plan   All questions were answered. The patient knows to call the clinic with any problems, questions or concerns. No barriers to learning was detected.  Loreatha Rodney, MD 5/5/202512:13 PM   HISTORY OF PRESENTING ILLNESS:  Angela Deleon 63 y.o. female with pmh of fibromyalgia, hypertension and depression was seen in medical oncology to discuss treatment for right-sided breast cancer.  Interval history Patient was seen today accompanied by husband for follow-up as toxicity check for Exemestane   She is doing well on exemestane .  Reports increasing hot flashes and memory issues since the beginning of this year.  States her psychiatrist started her on a new medication which she does not remember the name and had been gradually going up on the dose.  Once they reached 90 mg she has been having increasing side effects.    Continues to have chronic left breast pain and freely movable pea-sized nodule and had mammogram twice which had not shown any concerning masses.   Continues to have sharp pains in the right arm since the surgery.  Unchanged.  I have reviewed her chart and materials related to her cancer extensively and collaborated history with  the patient. Summary of oncologic history is as follows: Oncology History  Breast cancer (HCC)  07/15/2022 Initial Diagnosis   Patient noticed a palpable lump in the right upper outer quadrant of the breast.  Last mammogram was in 2007.   07/27/2022 Mammogram   Diagnostic mammogram and US   IMPRESSION: 1. Highly suspicious mass in the RIGHT breast at the 11 o'clock axis, 3 cm from the nipple, measuring 1.9 cm, corresponding to the patient's palpable area of concern. Ultrasound-guided biopsy is recommended. 2. Several additional hypoechoic areas adjacent to the dominant RIGHT breast mass, suspected satellite masses, most peripheral component located at the 10 o'clock axis, 5 cm from the nipple, measuring 1 cm. Ultrasound-guided biopsy is recommended this most peripheral hypoechoic area/satellite mass. 3. No evidence of malignancy within the LEFT breast.   08/13/2022 Pathology Results   DIAGNOSIS: A.  BREAST, RIGHT 10:00 5 CMFN (HEART); ULTRASOUND-GUIDED BIOPSY: - INVASIVE MAMMARY CARCINOMA, NO SPECIAL TYPE. Size of invasive carcinoma: 6 mm in this sample Histologic grade of invasive carcinoma: Grade 1                      Glandular/tubular differentiation score: 2  Nuclear pleomorphism score: 2                      Mitotic rate score: 1                      Total score: 5 Ductal carcinoma in situ: Not identified Lymphovascular invasion: Not identified ER/PR/HER2: Immunohistochemistry will be performed on block A1, with reflex to FISH for HER2 2+. The results will be reported in an addendum.  B.  BREAST, RIGHT 11:00 3 CMFN (VENUS); ULTRASOUND-GUIDED BIOPSY: - INVASIVE MAMMARY CARCINOMA, NO SPECIAL TYPE, INVOLVING ORGANIZING FAT NECROSIS. Size of invasive carcinoma: 12 mm in this sample Histologic grade of invasive carcinoma: Grade 2                      Glandular/tubular differentiation score: 3                      Nuclear pleomorphism score: 2                       Mitotic rate score: 1                      Total score: 6 Ductal carcinoma in situ: Not identified Lymphovascular invasion: Not identified   ADDENDUM:  CASE SUMMARY: BREAST BIOMARKER TESTS - PART A: RIGHT 10:00 5 CMFN  (HEART)  Estrogen Receptor (ER) Status: POSITIVE          Percentage of cells with nuclear positivity: Greater than 90%          Average intensity of staining: Strong   Progesterone Receptor (PgR) Status: POSITIVE          Percentage of cells with nuclear positivity: 51-90%          Average intensity of staining: Moderate   HER2 (by immunohistochemistry): NEGATIVE (Score 0)   Ki-67: Not performed   CASE SUMMARY: BREAST BIOMARKER TESTS - PART B: RIGHT 11:00 3 CMFN (VENUS) Estrogen Receptor (ER) Status: POSITIVE         Percentage of cells with nuclear positivity: Greater than 90%         Average intensity of staining: Strong  Progesterone Receptor (PgR) Status: POSITIVE         Percentage of cells with nuclear positivity: 11-50%         Average intensity of staining: Moderate  HER2 (by immunohistochemistry): NEGATIVE (Score 0)    08/27/2022 Definitive Surgery   Right mastectomy with SLNB by Dr. Charmel Cooter  Pathology showed 2.8 cm invasive mammary cancer upper outer quadrant, grade 2, 2 smaller foci in the lower outer quadrant measuring 5 mm and 2.5 mm grade 2, margins negative, 1/4 lymph node positive for macrometastatic disease 3.2 mm, ENE negative, no LVI, pathologic stage pT2 N1a     08/27/2022 Oncotype testing   Oncotype DX recurrence score 5.  No benefit from chemotherapy.    Genetic Testing   Negative genetic testing. No pathogenic variants identified on the [facility] Common Hereditary Cancers+RNA panel. The report date is 09/27/2022.  The Common Hereditary Cancers Panel + RNA offered by [facility] includes sequencing and/or deletion duplication testing of the following 48 genes: APC*, ATM*, AXIN2, BAP1, BARD1, BMPR1A, BRCA1, BRCA2, BRIP1, CDH1, CDK4,  CDKN2A (p14ARF), CDKN2A (p16INK4a), CHEK2, CTNNA1, DICER1*, EPCAM*, FH*, GREM1*, HOXB13, KIT, MBD4, MEN1*, MLH1*, MSH2*, MSH3*, MSH6*, MUTYH, NF1*, NTHL1, PALB2, PDGFRA, PMS2*, POLD1*, POLE, PTEN*,  RAD51C, RAD51D, SDHA*, SDHB, SDHC*, SDHD, SMAD4, SMARCA4, STK11, TP53, TSC1*, TSC2, VHL.     Menarche age 75 Age at first birth 72 Birth control yes in 2s Menopause age 65 HRT no  MEDICAL HISTORY:  Past Medical History:  Diagnosis Date   Acid reflux    Anxiety    Asthma    Breast cancer (HCC) 08/2022   right   Depression    Fibromyalgia    Hypertension, essential     SURGICAL HISTORY: Past Surgical History:  Procedure Laterality Date   BREAST BIOPSY Right    2006? neg   BREAST BIOPSY Right 08/13/2022   rt br u/s bx 11:00  venus clip path pending   BREAST BIOPSY Right 08/13/2022   US  RT BREAST BX W LOC DEV 1ST LESION IMG BX SPEC US  GUIDE 08/13/2022 [facility]   BREAST BIOPSY Right 08/13/2022   US  RT BREAST BX W LOC DEV EA ADD LESION IMG BX SPEC US  GUIDE 08/13/2022 [facility]   COLONOSCOPY     COLONOSCOPY WITH PROPOFOL  N/A 02/01/2023   Procedure: COLONOSCOPY WITH PROPOFOL ;  Surgeon: Quintin Buckle, DO;  Location: [facility] ENDOSCOPY;  Service: Gastroenterology;  Laterality: N/A;   CYST EXCISION Right    arm   DIRECT LARYNGOSCOPY  11/14/2013   ESOPHAGOGASTRODUODENOSCOPY (EGD) WITH PROPOFOL  N/A 02/01/2023   Procedure: ESOPHAGOGASTRODUODENOSCOPY (EGD) WITH PROPOFOL ;  Surgeon: Quintin Buckle, DO;  [facility] END[facility]COPY;  Service: Gastroenterology;  Laterality: N/A;   MASTECTOMY W/ SENTINEL NODE BIOPSY Right 08/27/2022   Procedure: MASTECTOMY WITH SENTINEL LYMPH NODE BIOPSY;  Surgeon: Eldred Grego, MD;  Location: [facility] ORS;  Service: General;  Laterality: Right;   TUBAL LIGATION  1992    SOCIAL HISTORY: Social History   Socioeconomic History   Marital status: Single    Spouse name: Not on file   Number of children: Not on file   Years of  education: Not on file   Highest education level: Not on file  Occupational History   Not on file  Tobacco Use   Smoking status: Every Day    Current packs/day: 0.50    Types: Cigarettes   Smokeless tobacco: Never  Vaping Use   Vaping status: Never Used  Substance and Sexual Activity   Alcohol use: Not Currently    Comment: social drinker   Drug use: Yes    Types: Marijuana   Sexual activity: Yes    Birth control/protection: Post-menopausal  Other Topics Concern   Not on file  Social History Narrative   Lives with fiance in a camper   Social Drivers of Health   Financial Resource Strain: High Risk (08/05/2023)   Received from [facility]   Overall Financial Resource Strain (CARDIA)    Difficulty of Paying Living Expenses: Hard  Food Insecurity: Food Insecurity Present (08/05/2023)   Received [facility]   Hunger Vital Sign    Worried About Running Out of Food in the Last Year: Often true    Ran Out of Food in the Last Year: Often true  Transportation Needs: Unmet Transportation Needs (08/05/2023)   Received from [facility] - Transportation    In the past 12 months, has lack of transportation kept you from medical appointments or from getting medications?: Yes    Lack of Transportation (Non-Medical): Yes  Physical Activity: Inactive (08/19/2022)   Exercise Vital Sign    Days of Exercise per Week: 0 days    Minutes of Exercise  per Session: 0 min  Stress: Stress Concern Present (08/19/2022)   [employer] of Occupational Health - Occupational Stress Questionnaire    Feeling of Stress : To some extent  Social Connections: Moderately Isolated (08/19/2022)   Social Connection and Isolation Panel [NHANES]    Frequency of Communication with Friends and Family: Three times a week    Frequency of Social Gatherings with Friends and Family: Once a week    Attends Religious Services: Never    Loss adjuster, chartered or Organizations: No    Attends Banker Meetings: Never    Marital Status: Living with partner  Intimate Partner Violence: Not At Risk (08/28/2022)   Humiliation, Afraid, Rape, and Kick questionnaire    Fear of Current or Ex-Partner: No    Emotionally Abused: No    Physically Abused: No    Sexually Abused: No    FAMILY HISTORY: Family History  Problem Relation Age of Onset   Colon cancer Mother 12   Ovarian cancer Mother 27   Cancer Brother        unk type   Ovarian cancer Maternal Grandmother    Esophageal cancer Half-Brother     ALLERGIES:  is allergic to iodinated contrast media and penicillins.  MEDICATIONS:  Current Outpatient Medications  Medication Sig Dispense Refill   atorvastatin (LIPITOR) 20 MG tablet Take 20 mg by mouth daily.     clonazePAM  (KLONOPIN ) 1 MG tablet Take 1 mg by mouth 2 (two) times daily as needed for anxiety.     Docusate Sodium (DSS) 100 MG CAPS Take 1 capsule by mouth 2 (two) times daily.     famotidine  (PEPCID ) 20 MG tablet Take 20 mg by mouth daily.     gabapentin  (NEURONTIN ) 400 MG capsule Take 400 mg by mouth 3 (three) times daily.     lisinopril -hydrochlorothiazide  (ZESTORETIC ) 20-12.5 MG tablet Take 1 tablet by mouth daily. 30 tablet 1   ondansetron  (ZOFRAN ) 4 MG tablet Take 1 tablet (4 mg total) by mouth every 8 (eight) hours as needed for up to 60 doses for nausea or vomiting. 60 tablet 0   QUEtiapine  (SEROQUEL ) 50 MG tablet Take 50 mg by mouth at bedtime.     sertraline  (ZOLOFT ) 100 MG tablet Take 150 mg by mouth daily.     buPROPion (WELLBUTRIN XL) 150 MG 24 hr tablet Take 150 mg by mouth every morning. (Patient not taking: Reported on 01/17/2024)     exemestane  (AROMASIN ) 25 MG tablet Take 1 tablet (25 mg total) by mouth daily. 90 tablet 2   ibuprofen  (ADVIL ) 200 MG tablet Take 600 mg by mouth daily as needed. (Patient not taking: Reported on 01/17/2024)     methocarbamol  (ROBAXIN ) 500 MG tablet Take 1 tablet (500 mg  total) by mouth 4 (four) times daily. (Patient not taking: Reported on 01/17/2024) 15 tablet 0   pantoprazole  (PROTONIX ) 40 MG tablet Take 1 tablet (40 mg total) by mouth 2 (two) times daily. (Patient not taking: Reported on 01/17/2024) 60 tablet 1   SUMAtriptan  (IMITREX ) 50 MG tablet Take 1 tablet (50 mg total) by mouth once for 1 dose. May repeat in 2 hours if headache persists or recurs. (Patient not taking: Reported on 01/17/2024) 10 tablet 0   No current facility-administered medications for this visit.    REVIEW OF SYSTEMS:   Pertinent information mentioned in HPI All other systems were reviewed with the patient and are negative.  PHYSICAL EXAMINATION: ECOG PERFORMANCE STATUS: 0 - Asymptomatic  Vitals:   01/17/24 1001 01/17/24 1030  BP: (!) 145/72 139/70  Pulse: 61   Resp: 17   Temp: 98.6 F (37 C)   SpO2: 97%       Filed Weights   01/17/24 1001  Weight: 196 lb 12.8 oz (89.3 kg)       GENERAL:alert, no distress and comfortable SKIN: skin color, texture, turgor are normal, no rashes or significant lesions EYES: normal, conjunctiva are pink and non-injected, sclera clear OROPHARYNX:no exudate, no erythema and lips, buccal mucosa, and tongue normal  NECK: supple, thyroid normal size, non-tender, without nodularity LYMPH:  no palpable lymphadenopathy in the cervical, axillary or inguinal LUNGS: clear to auscultation and percussion with normal breathing effort HEART: regular rate & rhythm and no murmurs and no lower extremity edema ABDOMEN:abdomen soft, non-tender and normal bowel sounds Musculoskeletal:no cyanosis of digits and no clubbing  PSYCH: alert & oriented x 3 with fluent speech NEURO: no focal motor/sensory deficits  Right breat-suture site healing well.  On the medial aspect slightly open but no signs of infection noted.  Bruising in the right axilla. Left breast, small movable pea-sized palpable tender area in the left breast in the lower outer  quadrant.   LABORATORY DATA:  I have reviewed the data as listed Lab Results  Component Value Date   WBC 7.6 01/17/2024   HGB 13.2 01/17/2024   HCT 40.5 01/17/2024   MCV 88.8 01/17/2024   PLT 176 01/17/2024   Recent Labs    02/19/23 1038 07/23/23 0928 01/17/24 0951  NA 141 138 137  K 4.2 4.1 3.4*  CL 104 106 101  CO2 29 27 25   GLUCOSE 110* 104* 108*  BUN 16 28* 18  CREATININE 0.91 1.24* 1.03*  CALCIUM 9.3 8.5* 8.6*  GFRNONAA >60 50* >60  PROT 7.7 6.6 7.1  ALBUMIN 3.9 3.5 3.6  AST 21 16 17   ALT 12 14 12   ALKPHOS 83 75 79  BILITOT 0.5 0.5 0.3    RADIOGRAPHIC STUDIES: I have personally reviewed the radiological images as listed and agreed with the findings in the report. No results found.

## 2024-01-21 ENCOUNTER — Telehealth: Payer: Self-pay | Admitting: *Deleted

## 2024-01-21 ENCOUNTER — Encounter: Payer: Self-pay | Admitting: *Deleted

## 2024-01-21 NOTE — Telephone Encounter (Signed)
 I called [employer] [phone] and they were not able to run the PA as I did not have the correct pt id number.  I called and spoke to patient. She said she has 2 health insurance plans and the [employer] plan has to be run first.   We did not have this [employer] card on file. Pt was unable to provide this BIN/RX PCN numbers and ID cards. [facility] to obtain this information. [facility] stated they "do not use covermymeds and provide clinical keys to provider's office."  The BIN for patient's [employer] insurance - [phone] RXPCN number- Adv ID card # [id number] RX group # [id number]  I used this information and submitted a request via covermymeds -- Angela Deleon (Key: [id number]) Could take up to 24 hours for insurance to approve.

## 2024-01-21 NOTE — Telephone Encounter (Signed)
 Called to say that there is a problem with her exemestane .  I called over to the pharmacy and spoke to the pharmacist and she says that her insurance is going through [employer] and she needs a prior authorization to get the exemestane .  The telephone number is [phone]. Patient wants call back so that she can get her medication

## 2024-01-24 ENCOUNTER — Ambulatory Visit: Payer: BLUE CROSS/BLUE SHIELD | Admitting: Internal Medicine

## 2024-01-24 ENCOUNTER — Other Ambulatory Visit: Payer: BLUE CROSS/BLUE SHIELD

## 2024-01-24 NOTE — Telephone Encounter (Signed)
 Reviewed cover [employer]. Insurance approval still pending.

## 2024-01-26 NOTE — Telephone Encounter (Signed)
 Insurance approved medication. Patient and pharmacy contacted to let them know insurance approved the aromasin .

## 2024-06-12 ENCOUNTER — Ambulatory Visit: Admitting: Anesthesiology

## 2024-06-12 ENCOUNTER — Ambulatory Visit
Admission: RE | Admit: 2024-06-12 | Discharge: 2024-06-12 | Disposition: A | Discharge status: Home / Self Care | Attending: Gastroenterology | Admitting: Gastroenterology

## 2024-06-12 ENCOUNTER — Encounter: Payer: Self-pay | Admitting: Gastroenterology

## 2024-06-12 ENCOUNTER — Encounter
Admission: RE | Disposition: A | Discharge status: Home / Self Care | Payer: Self-pay | Source: Home / Self Care | Attending: Gastroenterology

## 2024-06-12 DIAGNOSIS — Z6837 Body mass index (BMI) 37.0-37.9, adult: Secondary | ICD-10-CM | POA: Diagnosis not present

## 2024-06-12 DIAGNOSIS — K219 Gastro-esophageal reflux disease without esophagitis: Secondary | ICD-10-CM | POA: Diagnosis not present

## 2024-06-12 DIAGNOSIS — J4489 Other specified chronic obstructive pulmonary disease: Secondary | ICD-10-CM | POA: Diagnosis not present

## 2024-06-12 DIAGNOSIS — M797 Fibromyalgia: Secondary | ICD-10-CM | POA: Diagnosis not present

## 2024-06-12 DIAGNOSIS — K635 Polyp of colon: Secondary | ICD-10-CM | POA: Diagnosis not present

## 2024-06-12 DIAGNOSIS — D123 Benign neoplasm of transverse colon: Secondary | ICD-10-CM | POA: Insufficient documentation

## 2024-06-12 DIAGNOSIS — K5730 Diverticulosis of large intestine without perforation or abscess without bleeding: Secondary | ICD-10-CM | POA: Insufficient documentation

## 2024-06-12 DIAGNOSIS — E66813 Obesity, class 3: Secondary | ICD-10-CM | POA: Insufficient documentation

## 2024-06-12 DIAGNOSIS — F419 Anxiety disorder, unspecified: Secondary | ICD-10-CM | POA: Diagnosis not present

## 2024-06-12 DIAGNOSIS — K449 Diaphragmatic hernia without obstruction or gangrene: Secondary | ICD-10-CM | POA: Insufficient documentation

## 2024-06-12 DIAGNOSIS — K264 Chronic or unspecified duodenal ulcer with hemorrhage: Secondary | ICD-10-CM | POA: Diagnosis not present

## 2024-06-12 DIAGNOSIS — Z860100 Personal history of colon polyps, unspecified: Secondary | ICD-10-CM | POA: Diagnosis present

## 2024-06-12 DIAGNOSIS — D122 Benign neoplasm of ascending colon: Secondary | ICD-10-CM | POA: Insufficient documentation

## 2024-06-12 DIAGNOSIS — I10 Essential (primary) hypertension: Secondary | ICD-10-CM | POA: Insufficient documentation

## 2024-06-12 DIAGNOSIS — K2980 Duodenitis without bleeding: Secondary | ICD-10-CM | POA: Insufficient documentation

## 2024-06-12 DIAGNOSIS — D124 Benign neoplasm of descending colon: Secondary | ICD-10-CM | POA: Diagnosis not present

## 2024-06-12 DIAGNOSIS — K640 First degree hemorrhoids: Secondary | ICD-10-CM | POA: Insufficient documentation

## 2024-06-12 DIAGNOSIS — D120 Benign neoplasm of cecum: Secondary | ICD-10-CM | POA: Diagnosis not present

## 2024-06-12 DIAGNOSIS — K920 Hematemesis: Secondary | ICD-10-CM | POA: Diagnosis present

## 2024-06-12 DIAGNOSIS — F17200 Nicotine dependence, unspecified, uncomplicated: Secondary | ICD-10-CM | POA: Diagnosis not present

## 2024-06-12 DIAGNOSIS — Z1211 Encounter for screening for malignant neoplasm of colon: Secondary | ICD-10-CM | POA: Diagnosis not present

## 2024-06-12 DIAGNOSIS — R1010 Upper abdominal pain, unspecified: Secondary | ICD-10-CM | POA: Diagnosis present

## 2024-06-12 HISTORY — PX: ESOPHAGOGASTRODUODENOSCOPY: SHX5428

## 2024-06-12 HISTORY — PX: COLONOSCOPY: SHX5424

## 2024-06-12 HISTORY — PX: POLYPECTOMY: SHX149

## 2024-06-12 SURGERY — COLONOSCOPY
Anesthesia: General

## 2024-06-12 MED ORDER — STERILE WATER FOR IRRIGATION IR SOLN
Status: DC | PRN
  Administered 2024-06-12: 60 mL

## 2024-06-12 MED ORDER — LIDOCAINE HCL (CARDIAC) PF 100 MG/5ML IV SOSY
PREFILLED_SYRINGE | INTRAVENOUS | Status: DC | PRN
  Administered 2024-06-12: 100 mg via INTRAVENOUS

## 2024-06-12 MED ORDER — PROPOFOL 10 MG/ML IV BOLUS
INTRAVENOUS | Status: DC | PRN
Start: 2024-06-12 — End: 2024-06-12
  Administered 2024-06-12: 100 mg via INTRAVENOUS

## 2024-06-12 MED ORDER — LIDOCAINE HCL (PF) 2 % IJ SOLN
INTRAMUSCULAR | Status: AC
  Filled 2024-06-12: qty 5

## 2024-06-12 MED ORDER — PROPOFOL 1000 MG/100ML IV EMUL
INTRAVENOUS | Status: AC
  Filled 2024-06-12: qty 100

## 2024-06-12 MED ORDER — SODIUM CHLORIDE 0.9 % IV SOLN: INTRAVENOUS | Status: DC

## 2024-06-12 MED ORDER — PROPOFOL 500 MG/50ML IV EMUL
INTRAVENOUS | Status: DC | PRN
  Administered 2024-06-12: 150 ug/kg/min via INTRAVENOUS

## 2024-06-12 NOTE — Anesthesia Postprocedure Evaluation (Signed)
 Anesthesia Post Note  Patient: Angela Deleon  Procedure(s) Performed: COLONOSCOPY EGD (ESOPHAGOGASTRODUODENOSCOPY) POLYPECTOMY, INTESTINE  Patient location during evaluation: PACU Anesthesia Type: General Level of consciousness: awake and awake and alert Pain management: satisfactory to patient Vital Signs Assessment: post-procedure vital signs reviewed and stable Respiratory status: nonlabored ventilation Cardiovascular status: blood pressure returned to baseline Anesthetic complications: no   No notable events documented.   Last Vitals:  Vitals:   06/12/24 1051 06/12/24 1101  BP:  (!) 152/105  Pulse: (!) 57 64  Resp: 14 16  Temp:    SpO2: 100% 99%    Last Pain:  Vitals:   06/12/24 1101  TempSrc:   PainSc: 0-No pain                 VAN STAVEREN,[name]

## 2024-06-12 NOTE — Interval H&P Note (Signed)
 History and Physical Interval Note: Preprocedure H&P from 06/12/24  was reviewed and there was no interval change after seeing and examining the patient.  Written consent was obtained from the patient after discussion of risks, benefits, and alternatives. Patient has consented to proceed with Esophagogastroduodenoscopy and Colonoscopy with possible intervention   06/12/2024 9:50 AM  Christine Larrivee  has presented today for surgery, with the diagnosis of Hematemesis wiht nausea, abdominal pain, adenomatous polyp of the colon,GERD.  The various methods of treatment have been discussed with the patient and family. After consideration of risks, benefits and other options for treatment, the patient has consented to  Procedure(s): COLONOSCOPY (N/A) EGD (ESOPHAGOGASTRODUODENOSCOPY) (N/A) as a surgical intervention.  The patient's history has been reviewed, patient examined, no change in status, stable for surgery.  I have reviewed the patient's chart and labs.  Questions were answered to the patient's satisfaction.     Elspeth Ozell Jungling

## 2024-06-12 NOTE — Anesthesia Preprocedure Evaluation (Signed)
 Anesthesia Evaluation  Patient identified by MRN, date of birth, ID band Patient awake    Reviewed: Allergy & Precautions, NPO status , Patient's Chart, lab work & pertinent test results  Airway Mallampati: II  TM Distance: >3 FB Neck ROM: full    Dental  (+) Partial Upper   Pulmonary neg pulmonary ROS, asthma , COPD,  COPD inhaler, Current Smoker and Patient abstained from smoking.   Pulmonary exam normal  + decreased breath sounds      Cardiovascular Exercise Tolerance: Good hypertension, Pt. on medications negative cardio ROS Normal cardiovascular exam Rhythm:Regular Rate:Normal     Neuro/Psych   Anxiety     negative neurological ROS  negative psych ROS   GI/Hepatic negative GI ROS, Neg liver ROS,GERD  Medicated,,  Endo/Other  negative endocrine ROS  Class 3 obesity  Renal/GU negative Renal ROS  negative genitourinary   Musculoskeletal  (+)  Fibromyalgia -  Abdominal  (+) + obese  Peds negative pediatric ROS (+)  Hematology negative hematology ROS (+) Blood dyscrasia, anemia   Anesthesia Other Findings Past Medical History: No date: Acid reflux No date: Anxiety No date: Asthma 08/2022: Breast cancer (HCC)     Comment:  right No date: Depression No date: Fibromyalgia No date: Hypertension, essential  Past Surgical History: No date: BREAST BIOPSY; Right     Comment:  2006? neg 08/13/2022: BREAST BIOPSY; Right     Comment:  rt br u/s bx 11:00  venus clip path pending 08/13/2022: BREAST BIOPSY; Right     Comment:  US  RT BREAST BX W LOC DEV 1ST LESION IMG BX SPEC US                GUIDE 08/13/2022 [facility] 08/13/2022: BREAST BIOPSY; Right     Comment:  US  RT BREAST BX W LOC DEV EA ADD LESION IMG BX SPEC US                GUIDE 08/13/2022 [facility] No date: COLONOSCOPY 02/01/2023: COLONOSCOPY WITH PROPOFOL ; N/A     Comment:  Procedure: COLONOSCOPY WITH PROPOFOL ;  Surgeon: Onita Elspeth Sharper, DO;  Location: [facility] ENDOSCOPY;  Service:               Gastroenterology;  Laterality: N/A; No date: CYST EXCISION; Right     Comment:  arm 11/14/2013: DIRECT LARYNGOSCOPY 02/01/2023: ESOPHAGOGASTRODUODENOSCOPY (EGD) WITH PROPOFOL ; N/A     Comment:  Procedure: ESOPHAGOGASTRODUODENOSCOPY (EGD) WITH               PROPOFOL ;  Surgeon: Onita Elspeth Sharper, DO;  Location:              [facility] ENDOSCOPY;  Service: Gastroenterology;  Laterality:               N/A; 08/27/2022: MASTECTOMY W/ SENTINEL NODE BIOPSY; Right     Comment:  Procedure: MASTECTOMY WITH SENTINEL LYMPH NODE BIOPSY;                Surgeon: Rodolph Romano, MD;  Location: [facility] ORS;               Service: General;  Laterality: Right; 1992: TUBAL LIGATION  BMI    Body Mass Index: 37.79 kg/m      Reproductive/Obstetrics negative OB ROS  Anesthesia Physical Anesthesia Plan  ASA: 3  Anesthesia Plan: General   Post-op Pain Management:    Induction: Intravenous  PONV Risk Score and Plan: Propofol  infusion and TIVA  Airway Management Planned: Natural Airway and Nasal Cannula  Additional Equipment:   Intra-op Plan:   Post-operative Plan:   Informed Consent: I have reviewed the patients History and Physical, chart, labs and discussed the procedure including the risks, benefits and alternatives for the proposed anesthesia with the patient or authorized representative who has indicated his/her understanding and acceptance.     Dental Advisory Given  Plan Discussed with: CRNA  Anesthesia Plan Comments:         Anesthesia Quick Evaluation

## 2024-06-12 NOTE — Op Note (Signed)
 [facility] Gastroenterology Patient Name: Angela Deleon Procedure Date: 06/12/2024 9:50 AM MRN: [id number] Account #: [id number] Date of Birth: [birth year] Admit Type: Outpatient Age: 63 Room: [facility] ENDO ROOM 2 Gender: Female Note Status: Finalized Instrument Name: [facility] GI Scope [id number] Procedure:             Upper GI endoscopy Indications:           hematemesis with nausea, upper abdominal pain, gerd Providers:             Elspeth Ozell Jungling DO, DO Medicines:             Monitored Anesthesia Care Complications:         No immediate complications. Estimated blood loss:                         Minimal. Procedure:             Pre-Anesthesia Assessment:                        - Prior to the procedure, a History and Physical was                         performed, and patient medications and allergies were                         reviewed. The patient is competent. The risks and                         benefits of the procedure and the sedation options and                         risks were discussed with the patient. All questions                         were answered and informed consent was obtained.                         Patient identification and proposed procedure were                         verified by the physician, the nurse, the anesthetist                         and the technician in the endoscopy suite. Mental                         Status Examination: alert and oriented. Airway                         Examination: normal oropharyngeal airway and neck                         mobility. Respiratory Examination: poor air movement.                         CV Examination: RRR, no murmurs, no S3 or S4.  Prophylactic Antibiotics: The patient does not require                         prophylactic antibiotics. Prior Anticoagulants: The                         patient has taken no anticoagulant or antiplatelet                         agents.  ASA Grade Assessment: III - A patient with                         severe systemic disease. After reviewing the risks and                         benefits, the patient was deemed in satisfactory                         condition to undergo the procedure. The anesthesia                         plan was to use monitored anesthesia care (MAC).                         Immediately prior to administration of medications,                         the patient was re-assessed for adequacy to receive                         sedatives. The heart rate, respiratory rate, oxygen                         saturations, blood pressure, adequacy of pulmonary                         ventilation, and response to care were monitored                         throughout the procedure. The physical status of the                         patient was re-assessed after the procedure.                        After obtaining informed consent, the endoscope was                         passed under direct vision. Throughout the procedure,                         the patient's blood pressure, pulse, and oxygen                         saturations were monitored continuously. The Endoscope                         was introduced through the mouth, and advanced to the  second part of duodenum. The upper GI endoscopy was                         accomplished without difficulty. The patient tolerated                         the procedure well. Findings:      Localized mild inflammation characterized by erosions and erythema was       found in the duodenal bulb. Biopsies were taken with a cold forceps for       histology. Estimated blood loss was minimal.      The exam of the duodenum was otherwise normal.      A 4 cm hiatal hernia was present. Estimated blood loss: none.      The exam of the stomach was otherwise normal.      Esophagogastric landmarks were identified: the gastroesophageal junction       was  found at 35 cm from the incisors.      The Z-line was regular. Estimated blood loss: none.      The exam of the esophagus was otherwise normal. Impression:            - Duodenitis. Biopsied.                        - 4 cm hiatal hernia.                        - Esophagogastric landmarks identified.                        - Z-line regular. Recommendation:        - Patient has a contact number available for                         emergencies. The signs and symptoms of potential                         delayed complications were discussed with the patient.                         Return to normal activities tomorrow. Written                         discharge instructions were provided to the patient.                        - Discharge patient to home.                        - Resume previous diet.                        - Continue present medications.                        - Await pathology results.                        - Return to GI clinic as previously scheduled.                        -  Consider SIBO testing                        Consider Gastric emptying study                        Continue with twice daily proton pump inhibitor for 8                         weeks. then decrease to once a day                        - The findings and recommendations were discussed with                         the patient. Procedure Code(s):     --- Professional ---                        [phone], Esophagogastroduodenoscopy, flexible,                         transoral; with biopsy, single or multiple Diagnosis Code(s):     --- Professional ---                        K29.80, Duodenitis without bleeding                        K44.9, Diaphragmatic hernia without obstruction or                         gangrene CPT copyright 2022 American Medical Association. All rights reserved. The codes documented in this report are preliminary and upon coder review may  be revised to meet current compliance  requirements. Attending Participation:      I personally performed the entire procedure. Elspeth Jungling, DO Elspeth Ozell Jungling DO, DO 06/12/2024 10:41:05 AM This report has been signed electronically. Number of Addenda: 0 Note Initiated On: 06/12/2024 9:50 AM Estimated Blood Loss:  Estimated blood loss was minimal.      [facility]

## 2024-06-12 NOTE — H&P (Signed)
 Pre-Procedure H&P   Patient ID: Angela Deleon is a 63 y.o. female.  Gastroenterology Provider: Elspeth Ozell Jungling, DO  Referring Provider: Delmar Gails, NP PCP: Agrawal, Kavita, MD  Date: 06/12/2024  HPI Angela Deleon is a 63 y.o. female who presents today for Esophagogastroduodenoscopy and Colonoscopy for Hematemesis with nausea, upper abdominal pain, GERD, personal history of colon polyps .  When patient was previously on Femara  she was having increased nausea and vomiting. .  She did notice an episode of hematemesis with this.  She also has noted increased gas bloating and early satiety.  Since stopping femara  her symptoms have improved.  She last underwent EGD and colonoscopy in May 2024.  Prep was limited at that point in time and 5 tubular adenomas and 1 sessile serrated polyp were removed.  Diverticulosis and internal hemorrhoids were also appreciated.  She did have some esophageal narrowing at that time which was dilated.  Duodenitis was present with biopsies negative for H. pylori and celiac disease. An abnormality in the antrum was appreciated suspicious for fistula, however, this was not demonstrated on imaging and felt to be a duodenal diverticulum per radiology report  She has undergone genetic testing given family history of malignancy which was negative.   Past Medical History:  Diagnosis Date   Acid reflux    Anxiety    Asthma    Breast cancer (HCC) 08/2022   right   Depression    Fibromyalgia    Hypertension, essential     Past Surgical History:  Procedure Laterality Date   BREAST BIOPSY Right    2006? neg   BREAST BIOPSY Right 08/13/2022   rt br u/s bx 11:00  venus clip path pending   BREAST BIOPSY Right 08/13/2022   US  RT BREAST BX W LOC DEV 1ST LESION IMG BX SPEC US  GUIDE 08/13/2022 [facility]   BREAST BIOPSY Right 08/13/2022   US  RT BREAST BX W LOC DEV EA ADD LESION IMG BX SPEC US  GUIDE 08/13/2022 [facility]   COLONOSCOPY      COLONOSCOPY WITH PROPOFOL  N/A 02/01/2023   Procedure: COLONOSCOPY WITH PROPOFOL ;  Surgeon: Jungling Elspeth Ozell, DO;  Location: [facility] ENDOSCOPY;  Service: Gastroenterology;  Laterality: N/A;   CYST EXCISION Right    arm   DIRECT LARYNGOSCOPY  11/14/2013   ESOPHAGOGASTRODUODENOSCOPY (EGD) WITH PROPOFOL  N/A 02/01/2023   Procedure: ESOPHAGOGASTRODUODENOSCOPY (EGD) WITH PROPOFOL ;  Surgeon: Jungling Elspeth Ozell, DO;  Location: [facility] ENDOSCOPY;  Service: Gastroenterology;  Laterality: N/A;   MASTECTOMY W/ SENTINEL NODE BIOPSY Right 08/27/2022   Procedure: MASTECTOMY WITH SENTINEL LYMPH NODE BIOPSY;  Surgeon: Rodolph Romano, MD;  Location: [facility] ORS;  Service: General;  Laterality: Right;   TUBAL LIGATION  1992    Family History Mother- crc No other h/o GI disease or malignancy  Review of Systems  Constitutional:  Negative for activity change, appetite change, chills, diaphoresis, fatigue, fever and unexpected weight change.  HENT:  Negative for trouble swallowing and voice change.   Respiratory:  Negative for shortness of breath and wheezing.   Cardiovascular:  Negative for chest pain, palpitations and leg swelling.  Gastrointestinal:  Positive for abdominal pain, nausea and vomiting. Negative for abdominal distention, anal bleeding, blood in stool, constipation, diarrhea and rectal pain.  Musculoskeletal:  Negative for arthralgias and myalgias.  Skin:  Negative for color change and pallor.  Neurological:  Negative for dizziness, syncope and weakness.  Psychiatric/Behavioral:  Negative for confusion.   All other systems reviewed and are negative.  Medications No current facility-administered medications on file prior to encounter.   Current Outpatient Medications on File Prior to Encounter  Medication Sig Dispense Refill   exemestane  (AROMASIN ) 25 MG tablet Take 25 mg by mouth daily after breakfast.     lisinopril -hydrochlorothiazide  (ZESTORETIC ) 20-12.5 MG tablet Take 1 tablet by  mouth daily. 30 tablet 1   atorvastatin (LIPITOR) 20 MG tablet Take 20 mg by mouth daily.     buPROPion (WELLBUTRIN XL) 150 MG 24 hr tablet Take 150 mg by mouth every morning. (Patient not taking: Reported on 01/17/2024)     clonazePAM  (KLONOPIN ) 1 MG tablet Take 1 mg by mouth 2 (two) times daily as needed for anxiety.     Docusate Sodium (DSS) 100 MG CAPS Take 1 capsule by mouth 2 (two) times daily.     famotidine  (PEPCID ) 20 MG tablet Take 20 mg by mouth daily.     gabapentin  (NEURONTIN ) 400 MG capsule Take 400 mg by mouth 3 (three) times daily.     ibuprofen  (ADVIL ) 200 MG tablet Take 600 mg by mouth daily as needed. (Patient not taking: Reported on 01/17/2024)     methocarbamol  (ROBAXIN ) 500 MG tablet Take 1 tablet (500 mg total) by mouth 4 (four) times daily. (Patient not taking: Reported on 01/17/2024) 15 tablet 0   ondansetron  (ZOFRAN ) 4 MG tablet Take 1 tablet (4 mg total) by mouth every 8 (eight) hours as needed for up to 60 doses for nausea or vomiting. 60 tablet 0   pantoprazole  (PROTONIX ) 40 MG tablet Take 1 tablet (40 mg total) by mouth 2 (two) times daily. (Patient not taking: Reported on 01/17/2024) 60 tablet 1   QUEtiapine  (SEROQUEL ) 50 MG tablet Take 50 mg by mouth at bedtime.     sertraline  (ZOLOFT ) 100 MG tablet Take 150 mg by mouth daily.     SUMAtriptan  (IMITREX ) 50 MG tablet Take 1 tablet (50 mg total) by mouth once for 1 dose. May repeat in 2 hours if headache persists or recurs. (Patient not taking: Reported on 01/17/2024) 10 tablet 0    Pertinent medications related to GI and procedure were reviewed by me with the patient prior to the procedure   Current Facility-Administered Medications:    0.9 %  sodium chloride  infusion, , Intravenous, Continuous, Onita Elspeth Sharper, DO, Last Rate: 20 mL/hr at 06/12/24 0939, New Bag at 06/12/24 0939  sodium chloride  20 mL/hr at 06/12/24 9060       Allergies  Allergen Reactions   Amoxicillin Other (See Comments)   Iodinated Contrast  Media Swelling   Penicillins Anaphylaxis and Swelling   Allergies were reviewed by me prior to the procedure  Objective   Body mass index is 37.79 kg/m. Vitals:   06/12/24 0926 06/12/24 0936  BP:  (!) 151/64  Pulse:  63  Resp:  18  Temp:  (!) 97.2 F (36.2 C)  TempSrc:  Temporal  SpO2:  98%  Weight: 90.7 kg   Height: 5' 1 (1.549 m)      Physical Exam Vitals and nursing note reviewed.  Constitutional:      General: She is not in acute distress.    Appearance: Normal appearance. She is obese. She is not ill-appearing, toxic-appearing or diaphoretic.  HENT:     Head: Normocephalic and atraumatic.     Nose: Nose normal.     Mouth/Throat:     Mouth: Mucous membranes are moist.     Pharynx: Oropharynx is clear.  Eyes:  General: No scleral icterus.    Extraocular Movements: Extraocular movements intact.  Cardiovascular:     Rate and Rhythm: Normal rate and regular rhythm.     Heart sounds: Normal heart sounds. No murmur heard.    No friction rub. No gallop.  Pulmonary:     Effort: Pulmonary effort is normal. No respiratory distress.     Breath sounds: No wheezing, rhonchi or rales.     Comments: Diminished breath sounds bilaterally Abdominal:     General: Bowel sounds are normal. There is no distension.     Palpations: Abdomen is soft.     Tenderness: There is no abdominal tenderness. There is no guarding or rebound.  Musculoskeletal:     Cervical back: Neck supple.     Right lower leg: No edema.     Left lower leg: No edema.  Skin:    General: Skin is warm and dry.     Coloration: Skin is not jaundiced or pale.  Neurological:     General: No focal deficit present.     Mental Status: She is alert and oriented to person, place, and time. Mental status is at baseline.  Psychiatric:        Mood and Affect: Mood normal.        Behavior: Behavior normal.        Thought Content: Thought content normal.        Judgment: Judgment normal.      Assessment:  Ms.  Angela Deleon is a 63 y.o. female  who presents today for Esophagogastroduodenoscopy and Colonoscopy for Hematemesis with nausea, upper abdominal pain, GERD, personal history of colon polyps .  Plan:  Esophagogastroduodenoscopy and Colonoscopy with possible intervention today  Esophagogastroduodenoscopy and Colonoscopy with possible biopsy, control of bleeding, polypectomy, and interventions as necessary has been discussed with the patient/patient representative. Informed consent was obtained from the patient/patient representative after explaining the indication, nature, and risks of the procedure including but not limited to death, bleeding, perforation, missed neoplasm/lesions, cardiorespiratory compromise, and reaction to medications. Opportunity for questions was given and appropriate answers were provided. Patient/patient representative has verbalized understanding is amenable to undergoing the procedure.   Elspeth Ozell Jungling, DO  [facility]  Portions of the record may have been created with voice recognition software. Occasional wrong-word or 'sound-a-like' substitutions may have occurred due to the inherent limitations of voice recognition software.  Read the chart carefully and recognize, using context, where substitutions may have occurred.

## 2024-06-12 NOTE — Transfer of Care (Signed)
 Immediate Anesthesia Transfer of Care Note  Patient: Angela Deleon  Procedure(s) Performed: COLONOSCOPY EGD (ESOPHAGOGASTRODUODENOSCOPY) POLYPECTOMY, INTESTINE  Patient Location: PACU  Anesthesia Type:General  Level of Consciousness: awake and sedated  Airway & Oxygen Therapy: Patient Spontanous Breathing and Patient connected to face mask oxygen  Post-op Assessment: Report given to RN and Post -op Vital signs reviewed and stable  Post vital signs: Reviewed and stable  Last Vitals:  Vitals Value Taken Time  BP    Temp    Pulse    Resp    SpO2      Last Pain:  Vitals:   06/12/24 0936  TempSrc: Temporal  PainSc: 10-Worst pain ever         Complications: No notable events documented.

## 2024-06-12 NOTE — Op Note (Signed)
 [facility] Patient Name: Angela Deleon Procedure Date: 06/12/2024 9:47 AM MRN: [id number] Account #: [id number] Date of Birth: [birth year] Admit Type: Outpatient Age: 63 Room: [facility] ENDO ROOM 2 Gender: Female Note Status: Finalized Instrument Name: Colon Scope [id number] Procedure:             Colonoscopy Indications:           High risk colon cancer surveillance: Personal history                         of colonic polyps Providers:             Elspeth Ozell Jungling DO, DO Medicines:             Monitored Anesthesia Care Complications:         No immediate complications. Estimated blood loss:                         Minimal. Procedure:             Pre-Anesthesia Assessment:                        - Prior to the procedure, a History and Physical was                         performed, and patient medications and allergies were                         reviewed. The patient is competent. The risks and                         benefits of the procedure and the sedation options and                         risks were discussed with the patient. All questions                         were answered and informed consent was obtained.                         Patient identification and proposed procedure were                         verified by the physician, the nurse, the anesthetist                         and the technician in the endoscopy suite. Mental                         Status Examination: alert and oriented. Airway                         Examination: normal oropharyngeal airway and neck                         mobility. Respiratory Examination: poor air movement.                         CV Examination: RRR, no murmurs, no S3 or S4.  Prophylactic Antibiotics: The patient does not require                         prophylactic antibiotics. Prior Anticoagulants: The                         patient has taken no anticoagulant or  antiplatelet                         agents except for aspirin. ASA Grade Assessment: III -                         A patient with severe systemic disease. After                         reviewing the risks and benefits, the patient was                         deemed in satisfactory condition to undergo the                         procedure. The anesthesia plan was to use monitored                         anesthesia care (MAC). Immediately prior to                         administration of medications, the patient was                         re-assessed for adequacy to receive sedatives. The                         heart rate, respiratory rate, oxygen saturations,                         blood pressure, adequacy of pulmonary ventilation, and                         response to care were monitored throughout the                         procedure. The physical status of the patient was                         re-assessed after the procedure.                        After obtaining informed consent, the colonoscope was                         passed under direct vision. Throughout the procedure,                         the patient's blood pressure, pulse, and oxygen                         saturations were monitored continuously. The  Colonoscope was introduced through the anus and                         advanced to the the cecum, identified by appendiceal                         orifice and ileocecal valve. The colonoscopy was                         performed without difficulty. The patient tolerated                         the procedure well. The quality of the bowel                         preparation was evaluated using the BBPS [facility] Bowel                         Preparation Scale) with scores of: Right Colon = 2                         (minor amount of residual staining, small fragments of                         stool and/or opaque liquid, but mucosa seen well),                          Transverse Colon = 3 (entire mucosa seen well with no                         residual staining, small fragments of stool or opaque                         liquid) and Left Colon = 2 (minor amount of residual                         staining, small fragments of stool and/or opaque                         liquid, but mucosa seen well). The total BBPS score                         equals 7. The quality of the bowel preparation was                         good. The ileocecal valve, appendiceal orifice, and                         rectum were photographed. Findings:      The perianal and digital rectal examinations were normal. Pertinent       negatives include normal sphincter tone.      Multiple small-mouthed diverticula were found in the entire colon.       Estimated blood loss: none.      Non-bleeding internal hemorrhoids were found during retroflexion. The       hemorrhoids were Grade I (internal hemorrhoids that do not prolapse).  Estimated blood loss: none.      Two sessile polyps were found in the sigmoid colon. The polyps were 1 to       2 mm in size. These polyps were removed with a jumbo cold forceps.       Resection and retrieval were complete. Estimated blood loss was minimal.      Five sessile polyps were found in the descending colon, transverse       colon, ascending colon and cecum. The polyps were 3 to 9 mm in size.       These polyps were removed with a cold snare. Resection and retrieval       were complete. Estimated blood loss was minimal.      The exam was otherwise without abnormality on direct and retroflexion       views. Impression:            - Diverticulosis in the entire examined colon.                        - Non-bleeding internal hemorrhoids.                        - Two 1 to 2 mm polyps in the sigmoid colon, removed                         with a jumbo cold forceps. Resected and retrieved.                        - Five 3 to 9 mm  polyps in the descending colon, in                         the transverse colon, in the ascending colon and in                         the cecum, removed with a cold snare. Resected and                         retrieved.                        - The examination was otherwise normal on direct and                         retroflexion views. Recommendation:        - Patient has a contact number available for                         emergencies. The signs and symptoms of potential                         delayed complications were discussed with the patient.                         Return to normal activities tomorrow. Written                         discharge instructions were provided to the patient.                        -  Discharge patient to home.                        - Resume previous diet.                        - Continue present medications.                        - No ibuprofen , naproxen, or other non-steroidal                         anti-inflammatory drugs for 5 days after polyp removal.                        - Await pathology results.                        - Repeat colonoscopy for surveillance based on                         pathology results.                        - Return to referring physician as previously                         scheduled.                        - The findings and recommendations were discussed with                         the patient. Procedure Code(s):     --- Professional ---                        [phone], Colonoscopy, flexible; with removal of                         tumor(s), polyp(s), or other lesion(s) by snare                         technique                        45380, 59, Colonoscopy, flexible; with biopsy, single                         or multiple Diagnosis Code(s):     --- Professional ---                        Z86.010, Personal history of colonic polyps                        K64.0, First degree hemorrhoids                         D12.5, Benign neoplasm of sigmoid colon                        D12.4, Benign neoplasm of descending colon  D12.3, Benign neoplasm of transverse colon (hepatic                         flexure or splenic flexure)                        D12.2, Benign neoplasm of ascending colon                        D12.0, Benign neoplasm of cecum                        K57.30, Diverticulosis of large intestine without                         perforation or abscess without bleeding CPT copyright 2022 [employer]. All rights reserved. The codes documented in this report are preliminary and upon coder review may  be revised to meet current compliance requirements. Attending Participation:      I personally performed the entire procedure. Elspeth Jungling, DO Elspeth Ozell Jungling DO, DO 06/12/2024 10:47:41 AM This report has been signed electronically. Number of Addenda: 0 Note Initiated On: 06/12/2024 9:47 AM Scope Withdrawal Time: 0 hours 13 minutes 22 seconds  Total Procedure Duration: 0 hours 24 minutes 18 seconds  Estimated Blood Loss:  Estimated blood loss was minimal.      [facility]

## 2024-06-13 LAB — SURGICAL PATHOLOGY

## 2024-07-25 ENCOUNTER — Other Ambulatory Visit: Payer: Self-pay | Admitting: Family

## 2024-07-25 DIAGNOSIS — R140 Abdominal distension (gaseous): Secondary | ICD-10-CM

## 2024-07-25 DIAGNOSIS — R1084 Generalized abdominal pain: Secondary | ICD-10-CM

## 2024-07-27 ENCOUNTER — Ambulatory Visit
Admission: RE | Admit: 2024-07-27 | Discharge: 2024-07-27 | Disposition: A | Discharge status: Home / Self Care | Source: Ambulatory Visit | Attending: Family | Admitting: Family

## 2024-07-27 DIAGNOSIS — R1084 Generalized abdominal pain: Secondary | ICD-10-CM | POA: Diagnosis present

## 2024-07-27 DIAGNOSIS — R140 Abdominal distension (gaseous): Secondary | ICD-10-CM | POA: Diagnosis present

## 2024-08-18 ENCOUNTER — Other Ambulatory Visit: Payer: Self-pay | Admitting: *Deleted

## 2024-08-18 DIAGNOSIS — C50911 Malignant neoplasm of unspecified site of right female breast: Secondary | ICD-10-CM

## 2024-08-21 ENCOUNTER — Other Ambulatory Visit: Attending: Internal Medicine

## 2024-08-21 ENCOUNTER — Encounter: Payer: Self-pay | Admitting: Internal Medicine

## 2024-08-21 ENCOUNTER — Inpatient Hospital Stay: Admitting: Internal Medicine

## 2024-08-21 VITALS — BP 138/80 | HR 59 | Temp 98.2°F | Resp 18 | Ht 61.0 in | Wt 202.6 lb

## 2024-08-21 DIAGNOSIS — F419 Anxiety disorder, unspecified: Secondary | ICD-10-CM | POA: Diagnosis not present

## 2024-08-21 DIAGNOSIS — M79601 Pain in right arm: Secondary | ICD-10-CM | POA: Diagnosis not present

## 2024-08-21 DIAGNOSIS — C50411 Malignant neoplasm of upper-outer quadrant of right female breast: Secondary | ICD-10-CM | POA: Insufficient documentation

## 2024-08-21 DIAGNOSIS — F17210 Nicotine dependence, cigarettes, uncomplicated: Secondary | ICD-10-CM | POA: Diagnosis not present

## 2024-08-21 DIAGNOSIS — C50911 Malignant neoplasm of unspecified site of right female breast: Secondary | ICD-10-CM

## 2024-08-21 DIAGNOSIS — Z8041 Family history of malignant neoplasm of ovary: Secondary | ICD-10-CM | POA: Insufficient documentation

## 2024-08-21 DIAGNOSIS — Z1721 Progesterone receptor positive status: Secondary | ICD-10-CM | POA: Diagnosis not present

## 2024-08-21 DIAGNOSIS — Z170 Estrogen receptor positive status [ER+]: Secondary | ICD-10-CM | POA: Diagnosis not present

## 2024-08-21 DIAGNOSIS — Z1732 Human epidermal growth factor receptor 2 negative status: Secondary | ICD-10-CM | POA: Insufficient documentation

## 2024-08-21 DIAGNOSIS — Z800 Family history of malignant neoplasm of digestive organs: Secondary | ICD-10-CM | POA: Diagnosis not present

## 2024-08-21 LAB — CMP (CANCER CENTER ONLY)
ALT: 19 U/L (ref 0–44)
AST: 24 U/L (ref 15–41)
Albumin: 4.2 g/dL (ref 3.5–5.0)
Alkaline Phosphatase: 102 U/L (ref 38–126)
Anion gap: 11 (ref 5–15)
BUN: 17 mg/dL (ref 8–23)
CO2: 27 mmol/L (ref 22–32)
Calcium: 10 mg/dL (ref 8.9–10.3)
Chloride: 102 mmol/L (ref 98–111)
Creatinine: 1.13 mg/dL — ABNORMAL HIGH (ref 0.44–1.00)
GFR, Estimated: 55 mL/min — ABNORMAL LOW (ref >60)
Glucose, Bld: 106 mg/dL — ABNORMAL HIGH (ref 70–99)
Potassium: 5.1 mmol/L (ref 3.5–5.1)
Sodium: 140 mmol/L (ref 135–145)
Total Bilirubin: 0.4 mg/dL (ref 0.0–1.2)
Total Protein: 7.4 g/dL (ref 6.5–8.1)

## 2024-08-21 LAB — CBC WITH DIFFERENTIAL (CANCER CENTER ONLY)
Abs Immature Granulocytes: 0.06 K/uL (ref 0.00–0.07)
Basophils Absolute: 0 K/uL (ref 0.0–0.1)
Basophils Relative: 0 %
Eosinophils Absolute: 0.1 K/uL (ref 0.0–0.5)
Eosinophils Relative: 1 %
HCT: 44 % (ref 36.0–46.0)
Hemoglobin: 14.4 g/dL (ref 12.0–15.0)
Immature Granulocytes: 1 %
Lymphocytes Relative: 22 %
Lymphs Abs: 1.5 K/uL (ref 0.7–4.0)
MCH: 29.2 pg (ref 26.0–34.0)
MCHC: 32.7 g/dL (ref 30.0–36.0)
MCV: 89.2 fL (ref 80.0–100.0)
Monocytes Absolute: 0.5 K/uL (ref 0.1–1.0)
Monocytes Relative: 7 %
Neutro Abs: 4.8 K/uL (ref 1.7–7.7)
Neutrophils Relative %: 69 %
Platelet Count: 187 K/uL (ref 150–400)
RBC: 4.93 MIL/uL (ref 3.87–5.11)
RDW: 13.6 % (ref 11.5–15.5)
WBC Count: 7 K/uL (ref 4.0–10.5)
nRBC: 0 % (ref 0.0–0.2)

## 2024-08-21 NOTE — Progress Notes (Signed)
 MAMMOGRAM SCH'D FOR 08/28/24.

## 2024-08-21 NOTE — Progress Notes (Signed)
 [facility] CONSULT NOTE  Patient Care Team: Devine, Jaimie, NP as PCP - General (Family Medicine) Georgina Shasta POUR, RN as Oncology Nurse Navigator Rodolph Romano, MD as Consulting Physician (General Surgery) Rennie Cindy SAUNDERS, MD as Consulting Physician (Oncology)  CHIEF COMPLAINTS/PURPOSE OF CONSULTATION: breast cancer  Oncology History Overview Note  #  # JAN 2024-Right breast invasive ductal cancer, pathologic stage Ib (pT2N1) ER/PR+, HER2-; [self palpable]; Dr.Cintron. s/p right-sided mastectomy with SLNB; ENE negative, no LVI, pathologic stage pT2 N1a.- by Dr. Cesar on 08/27/2022.  Pathology showed 2.8 cm invasive mammary cancer upper outer quadrant, grade 2, 2 smaller foci in the lower outer quadrant measuring 5 mm and 2.5 mm grade 2, margins negative, 1/4 lymph node positive for macrometastatic disease 3.2 mm,    -Oncotype DX testing showed low recurrence score of 5.  No benefit from chemotherapy. Was seen by Dr. Lenn and no role for radiation.    Patient started letrozole  on 11/13/2022.  Discontinued on 12/25/2022 due to GI symptoms such as nausea, vomiting, worsening fatigue, myalgias, arthralgias    -Switched to exemestane  25 mg on 01/15/2023.  Tolerating well.  Plan for 5 years.      # Family history of colon, uterine and ovarian cancer -[facility] gene panel from 09/29/2022 negative.   Malignant neoplasm of upper-outer quadrant of right breast in female, estrogen receptor positive (HCC)  07/15/2022 Initial Diagnosis   Patient noticed a palpable lump in the right upper outer quadrant of the breast.  Last mammogram was in 2007.   07/27/2022 Mammogram   Diagnostic mammogram and US   IMPRESSION: 1. Highly suspicious mass in the RIGHT breast at the 11 o'clock axis, 3 cm from the nipple, measuring 1.9 cm, corresponding to the patient's palpable area of concern. Ultrasound-guided biopsy is recommended. 2. Several additional hypoechoic areas adjacent to the  dominant RIGHT breast mass, suspected satellite masses, most peripheral component located at the 10 o'clock axis, 5 cm from the nipple, measuring 1 cm. Ultrasound-guided biopsy is recommended this most peripheral hypoechoic area/satellite mass. 3. No evidence of malignancy within the LEFT breast.   08/13/2022 Pathology Results   DIAGNOSIS: A.  BREAST, RIGHT 10:00 5 CMFN (HEART); ULTRASOUND-GUIDED BIOPSY: - INVASIVE MAMMARY CARCINOMA, NO SPECIAL TYPE. Size of invasive carcinoma: 6 mm in this sample Histologic grade of invasive carcinoma: Grade 1                      Glandular/tubular differentiation score: 2                      Nuclear pleomorphism score: 2                      Mitotic rate score: 1                      Total score: 5 Ductal carcinoma in situ: Not identified Lymphovascular invasion: Not identified ER/PR/HER2: Immunohistochemistry will be performed on block A1, with reflex to FISH for HER2 2+. The results will be reported in an addendum.  B.  BREAST, RIGHT 11:00 3 CMFN (VENUS); ULTRASOUND-GUIDED BIOPSY: - INVASIVE MAMMARY CARCINOMA, NO SPECIAL TYPE, INVOLVING ORGANIZING FAT NECROSIS. Size of invasive carcinoma: 12 mm in this sample Histologic grade of invasive carcinoma: Grade 2                      Glandular/tubular differentiation score: 3  Nuclear pleomorphism score: 2                      Mitotic rate score: 1                      Total score: 6 Ductal carcinoma in situ: Not identified Lymphovascular invasion: Not identified   ADDENDUM:  CASE SUMMARY: BREAST BIOMARKER TESTS - PART A: RIGHT 10:00 5 CMFN  (HEART)  Estrogen Receptor (ER) Status: POSITIVE          Percentage of cells with nuclear positivity: Greater than 90%          Average intensity of staining: Strong   Progesterone Receptor (PgR) Status: POSITIVE          Percentage of cells with nuclear positivity: 51-90%          Average intensity of staining: Moderate   HER2 (by  immunohistochemistry): NEGATIVE (Score 0)   Ki-67: Not performed   CASE SUMMARY: BREAST BIOMARKER TESTS - PART B: RIGHT 11:00 3 CMFN (VENUS) Estrogen Receptor (ER) Status: POSITIVE         Percentage of cells with nuclear positivity: Greater than 90%         Average intensity of staining: Strong  Progesterone Receptor (PgR) Status: POSITIVE         Percentage of cells with nuclear positivity: 11-50%         Average intensity of staining: Moderate  HER2 (by immunohistochemistry): NEGATIVE (Score 0)    08/27/2022 Definitive Surgery   Right mastectomy with SLNB by Dr. Cesar  Pathology showed 2.8 cm invasive mammary cancer upper outer quadrant, grade 2, 2 smaller foci in the lower outer quadrant measuring 5 mm and 2.5 mm grade 2, margins negative, 1/4 lymph node positive for macrometastatic disease 3.2 mm, ENE negative, no LVI, pathologic stage pT2 N1a     08/27/2022 Oncotype testing   Oncotype DX recurrence score 5.  No benefit from chemotherapy.    Genetic Testing   Negative genetic testing. No pathogenic variants identified on the [facility] Common Hereditary Cancers+RNA panel. The report date is 09/27/2022.  The Common Hereditary Cancers Panel + RNA offered by [facility] includes sequencing and/or deletion duplication testing of the following 48 genes: APC*, ATM*, AXIN2, BAP1, BARD1, BMPR1A, BRCA1, BRCA2, BRIP1, CDH1, CDK4, CDKN2A (p14ARF), CDKN2A (p16INK4a), CHEK2, CTNNA1, DICER1*, EPCAM*, FH*, GREM1*, HOXB13, KIT, MBD4, MEN1*, MLH1*, MSH2*, MSH3*, MSH6*, MUTYH, NF1*, NTHL1, PALB2, PDGFRA, PMS2*, POLD1*, POLE, PTEN*, RAD51C, RAD51D, SDHA*, SDHB, SDHC*, SDHD, SMAD4, SMARCA4, STK11, TP53, TSC1*, TSC2, VHL.      HISTORY OF PRESENTING ILLNESS: Patient ambulating- independently.   Accompanied by family.   Angela Deleon 63 y.o.  female pleasant patient with a history of stage I breast cancer ER/PR positive HER2 negative on aromatase inhibitor-exemestane  is here for follow-up  Discussed the  use of AI scribe software for clinical note transcription with the patient, who gave verbal consent to proceed.  History of Present Illness   Angela Deleon is a 63 year old female with breast cancer who presents for follow-up.  She has a history of breast cancer, for which she underwent a mastectomy. The cancer was identified as stage one, and she has been on a daily cancer medication. Initially, she experienced adverse effects with the letrozole , but the current medication, examestane is well-tolerated.  She experiences hot flashes and night sweats as side effects of the medication. Additionally, she notes episodes of forgetfulness, where she 'completely  forgets what I'm talking about' if she pauses for a few seconds.  She has a history of anxiety and continues to follow up with her psychiatrist in [place]. She mentions that smoking helps her relax and improves her appetite when she does not feel like eating.  Post-mastectomy, she experiences soreness and nerve shooting pain, which she attributes to the surgery. She has been on gabapentin , initially for fibromyalgia, but the dosage has increased over time due to the post-surgical pain. She feels that she has developed a tolerance to the medication.  She has an upcoming mammogram appointment and will see her primary care physician the following day.       Review of Systems  Constitutional:  Positive for diaphoresis and malaise/fatigue. Negative for chills, fever and weight loss.  HENT:  Negative for nosebleeds and sore throat.   Eyes:  Negative for double vision.  Respiratory:  Negative for cough, hemoptysis, sputum production, shortness of breath and wheezing.   Cardiovascular:  Negative for chest pain, palpitations, orthopnea and leg swelling.  Gastrointestinal:  Negative for abdominal pain, blood in stool, constipation, diarrhea, heartburn, melena, nausea and vomiting.  Genitourinary:  Negative for dysuria, frequency and urgency.   Musculoskeletal:  Negative for back pain and joint pain.  Skin: Negative.  Negative for itching and rash.  Neurological:  Negative for dizziness, tingling, focal weakness, weakness and headaches.  Endo/Heme/Allergies:  Does not bruise/bleed easily.  Psychiatric/Behavioral:  Negative for depression. The patient is not nervous/anxious and does not have insomnia.     MEDICAL HISTORY:  Past Medical History:  Diagnosis Date   Acid reflux    Anxiety    Asthma    Breast cancer (HCC) 08/2022   right   Depression    Fibromyalgia    Hypertension, essential     SURGICAL HISTORY: Past Surgical History:  Procedure Laterality Date   BREAST BIOPSY Right    2006? neg   BREAST BIOPSY Right 08/13/2022   rt br u/s bx 11:00  venus clip path pending   BREAST BIOPSY Right 08/13/2022   US  RT BREAST BX W LOC DEV 1ST LESION IMG BX SPEC US  GUIDE 08/13/2022 [facility]   BREAST BIOPSY Right 08/13/2022   US  RT BREAST BX W LOC DEV EA ADD LESION IMG BX SPEC US  GUIDE 08/13/2022 [facility]   COLONOSCOPY     COLONOSCOPY N/A 06/12/2024   Procedure: COLONOSCOPY;  Surgeon: Onita Elspeth Sharper, DO;  [facility] ENDOSCOPY;  Service: Gastroenterology;  Laterality: N/A;   COLONOSCOPY WITH PROPOFOL  N/A 02/01/2023   Procedure: COLONOSCOPY WITH PROPOFOL ;  Surgeon: Onita Elspeth Sharper, DO;  Location: [facility] ENDOSCOPY;  Service: Gastroenterology;  Laterality: N/A;   CYST EXCISION Right    arm   DIRECT LARYNGOSCOPY  11/14/2013   ESOPHAGOGASTRODUODENOSCOPY N/A 06/12/2024   Procedure: EGD (ESOPHAGOGASTRODUODENOSCOPY);  Surgeon: Onita Elspeth Sharper, DO;  Location: [facility] ENDOSCOPY;  Service: Gastroenterology;  Laterality: N/A;   ESOPHAGOGASTRODUODENOSCOPY (EGD) WITH PROPOFOL  N/A 02/01/2023   Procedure: ESOPHAGOGASTRODUODENOSCOPY (EGD) WITH PROPOFOL ;  Surgeon: Onita Elspeth Sharper, DO;  Location: [facility] ENDOSCOPY;  Service: Gastroenterology;  Laterality: N/A;   MASTECTOMY W/ SENTINEL NODE BIOPSY Right  08/27/2022   Procedure: MASTECTOMY WITH SENTINEL LYMPH NODE BIOPSY;  Surgeon: Rodolph Romano, MD;  Location: [facility] ORS;  Service: General;  Laterality: Right;   POLYPECTOMY  06/12/2024   Procedure: POLYPECTOMY, INTESTINE;  Surgeon: Onita Elspeth Sharper, DO;  Location: [facility] ENDOSCOPY;  Service: Gastroenterology;;   TUBAL LIGATION  1992    SOCIAL HISTORY: Social History   Socioeconomic  History   Marital status: Single    Spouse name: Not on file   Number of children: Not on file   Years of education: Not on file   Highest education level: Not on file  Occupational History   Not on file  Tobacco Use   Smoking status: Every Day    Current packs/day: 0.50    Types: Cigarettes   Smokeless tobacco: Never  Vaping Use   Vaping status: Never Used  Substance and Sexual Activity   Alcohol use: Not Currently    Comment: social drinker   Drug use: Yes    Types: Marijuana   Sexual activity: Yes    Birth control/protection: Post-menopausal  Other Topics Concern   Not on file  Social History Narrative   Lives with fiance in a camper   Social Drivers of Health   Financial Resource Strain: Medium Risk (08/08/2024)   Received from [facility]   Overall Financial Resource Strain (CARDIA)    Difficulty of Paying Living Expenses: Somewhat hard  Food Insecurity: Food Insecurity Present (08/08/2024)   Received from [facility]   Hunger Vital Sign    Within the past 12 months, you worried that your food would run out before you got the money to buy more.: Often true    Within the past 12 months, the food you bought just didn't last and you didn't have money to get more.: Often true  Transportation Needs: No Transportation Needs (08/08/2024)   Received from [facility] - Transportation    In the past 12 months, has lack of transportation kept you from medical appointments or from getting medications?: No    Lack of  Transportation (Non-Medical): No  Physical Activity: Inactive (08/08/2024)   Received from [facility]   Exercise Vital Sign    On average, how many days per week do you engage in moderate to strenuous exercise (like a brisk walk)?: 0 days    On average, how many minutes do you engage in exercise at this level?: 0 min  Stress: Stress Concern Present (08/08/2024)   Received from [facility] - Occupational Stress Questionnaire    Feeling of Stress : To some extent  Social Connections: Socially Isolated (08/08/2024)   Received from [facility]   Social Connection and Isolation Panel    In a typical week, how many times do you talk on the phone with family, friends, or neighbors?: More than three times a week    How often do you get together with friends or relatives?: Never    How often do you attend church or religious services?: Never    Do you belong to any clubs or organizations such as church groups, unions, fraternal or athletic groups, or school groups?: No    How often do you attend meetings of the clubs or organizations you belong to?: Never    Are you married, widowed, divorced, separated, never married, or living with a partner?: Separated  Intimate Partner Violence: Not At Risk (08/28/2022)   Humiliation, Afraid, Rape, and Kick questionnaire    Fear of Current or Ex-Partner: No    Emotionally Abused: No    Physically Abused: No    Sexually Abused: No    FAMILY HISTORY: Family History  Problem Relation Age of Onset   Colon cancer Mother 57   Ovarian cancer Mother 66   Cancer  Brother        unk type   Ovarian cancer Maternal Grandmother    Esophageal cancer Half-Brother     ALLERGIES:  is allergic to amoxicillin, iodinated contrast media, and penicillins.  MEDICATIONS:  Current Outpatient Medications  Medication Sig Dispense Refill   atorvastatin (LIPITOR) 20 MG tablet  Take 20 mg by mouth daily.     buPROPion (WELLBUTRIN XL) 150 MG 24 hr tablet Take 150 mg by mouth every morning. (Patient taking differently: Take 300 mg by mouth every morning.)     clonazePAM  (KLONOPIN ) 1 MG tablet Take 1 mg by mouth 2 (two) times daily as needed for anxiety.     Docusate Sodium (DSS) 100 MG CAPS Take 1 capsule by mouth 2 (two) times daily.     DULoxetine (CYMBALTA) 30 MG capsule Take 30 mg by mouth at bedtime.     exemestane  (AROMASIN ) 25 MG tablet Take 25 mg by mouth daily after breakfast.     gabapentin  (NEURONTIN ) 400 MG capsule Take 400 mg by mouth 3 (three) times daily. (Patient taking differently: Take 800 mg by mouth 3 (three) times daily.)     lisinopril -hydrochlorothiazide  (ZESTORETIC ) 20-12.5 MG tablet Take 1 tablet by mouth daily. (Patient taking differently: Take 1 tablet by mouth daily. TAKES 20/25 MG QD) 30 tablet 1   omeprazole  (PRILOSEC) 40 MG capsule Take 40 mg by mouth daily.     ondansetron  (ZOFRAN ) 4 MG tablet Take 1 tablet (4 mg total) by mouth every 8 (eight) hours as needed for up to 60 doses for nausea or vomiting. 60 tablet 0   QUEtiapine  (SEROQUEL ) 50 MG tablet Take 50 mg by mouth at bedtime.     sertraline  (ZOLOFT ) 100 MG tablet Take 150 mg by mouth daily. (Patient taking differently: Take 200 mg by mouth daily.)     No current facility-administered medications for this visit.    PHYSICAL EXAMINATION:   Vitals:   08/21/24 1001  BP: 138/80  Pulse: (!) 59  Resp: 18  Temp: 98.2 F (36.8 C)  SpO2: 93%   Filed Weights   08/21/24 1001  Weight: 202 lb 9.6 oz (91.9 kg)    Physical Exam Vitals and nursing note reviewed.  HENT:     Head: Normocephalic and atraumatic.     Mouth/Throat:     Pharynx: Oropharynx is clear.  Eyes:     Extraocular Movements: Extraocular movements intact.     Pupils: Pupils are equal, round, and reactive to light.  Cardiovascular:     Rate and Rhythm: Normal rate and regular rhythm.  Pulmonary:     Comments:  Decreased breath sounds bilaterally.  Abdominal:     Palpations: Abdomen is soft.  Musculoskeletal:        General: Normal range of motion.     Cervical back: Normal range of motion.  Skin:    General: Skin is warm.  Neurological:     General: No focal deficit present.     Mental Status: She is alert and oriented to person, place, and time.  Psychiatric:        Behavior: Behavior normal.        Judgment: Judgment normal.     LABORATORY DATA:  I have reviewed the data as listed Lab Results  Component Value Date   WBC 7.0 08/21/2024   HGB 14.4 08/21/2024   HCT 44.0 08/21/2024   MCV 89.2 08/21/2024   PLT 187 08/21/2024   Recent Labs    01/17/24 0951  NA 137  K 3.4*  CL 101  CO2 25  GLUCOSE 108*  BUN 18  CREATININE 1.03*  CALCIUM 8.6*  GFRNONAA >60  PROT 7.1  ALBUMIN 3.6  AST 17  ALT 12  ALKPHOS 79  BILITOT 0.3    RADIOGRAPHIC STUDIES: I have personally reviewed the radiological images as listed and agreed with the findings in the report. US  Abdomen Complete Result Date: 07/27/2024 EXAM: COMPLETE ABDOMINAL ULTRASOUND COMPARISON: US  Abdomen 04/16/2005. CLINICAL HISTORY: Abdominal pain, distension. TECHNIQUE: Real-time ultrasonography of the abdomen was performed. FINDINGS: LIVER: The liver demonstrates normal echogenicity. No intrahepatic biliary ductal dilatation. A 1.6 cm cyst is noted in the caudate lobe of the liver. BILIARY SYSTEM: No pericholecystic fluid or wall thickening. No cholelithiasis. Common bile duct is within normal limits measuring 0.3 mm. KIDNEYS: Normal size and contour of kidneys. Right kidney measures 10.4 cm in length. Left kidney measures 10.0 cm in length. Normal cortical echogenicity. No hydronephrosis. No calculus. No mass. PANCREAS: Visualized portions of the pancreas are unremarkable. SPLEEN: No acute abnormality. Spleen length measures 10.4 cm. VESSELS: Visualized portion of the aorta is normal. Visualized portion of the inferior vena cava  is normal. OTHER: No ascites. IMPRESSION: 1. No acute findings. Electronically signed by: Lynwood Seip MD 07/27/2024 11:16 AM EST RP Workstation: [id number]     Malignant neoplasm of upper-outer quadrant of right breast in female, estrogen receptor positive (HCC) # JAN 2024-Right breast invasive ductal cancer, pathologic stage Ib (pT2N1) ER/PR+, HER2-; [self palpable]; Dr.Cintron. s/p right-sided mastectomy with SLNB; ENE negative, no LVI, pathologic stage pT2 N1a. -Oncotype DX testing showed low recurrence score of 5.  No benefit from chemotherapy. Was seen by Dr. Lenn and no role for radiation.  # Currently on exemestane  25 mg on 01/15/2023.  Tolerating well.  Plan for 5 years.   Left breast screening mammogram from December 2024 was negative.  Pending December 2025.   --DEXA scan from 12/30/2022 showed normal density in the spine, osteopenia in hip.  Continue with calcium vitamin D supplements.  Repeat DEXA due in April 2026.   # Right arm pain- -Present since surgery.  Neuropathic -Continue with gabapentin .    # Anxiety- -Patient follows with [facility], Dr. Deane - [place], [place]. She is on multiple medications-Klonopin , Wellbutrin, Seroquel , Zoloft .   Consider switching to Zoloft  to Celexa which could potentially help with induced hot flashes.  # DISPOSITION: # follow up in 6 months- MD; labs- cbc/cmp; Vit D 25-OH- Dr.B    Above plan of care was discussed with patient/family in detail.  My contact information was given to the patient/family.       Cindy JONELLE Joe, MD 08/21/2024 10:55 AM

## 2024-08-21 NOTE — Assessment & Plan Note (Addendum)
#   JAN 2024-Right breast invasive ductal cancer, pathologic stage Ib (pT2N1) ER/PR+, HER2-; [self palpable]; Dr.Cintron. s/p right-sided mastectomy with SLNB; ENE negative, no LVI, pathologic stage pT2 N1a. -Oncotype DX testing showed low recurrence score of 5.  No benefit from chemotherapy. Was seen by Dr. Lenn and no role for radiation.  # Currently on exemestane  25 mg on 01/15/2023.  Tolerating well.  Plan for 5 years.   Left breast screening mammogram from December 2024 was negative.  Pending December 2025.   --DEXA scan from 12/30/2022 showed normal density in the spine, osteopenia in hip.  Continue with calcium vitamin D supplements.  Repeat DEXA due in April 2026.   # Right arm pain- -Present since surgery.  Neuropathic -Continue with gabapentin .    # Anxiety- -Patient follows with [facility], Dr. Deane - [facility], [place]. She is on multiple medications-Klonopin , Wellbutrin, Seroquel , Zoloft .   Consider switching to Zoloft  to Celexa which could potentially help with induced hot flashes.  # DISPOSITION: # follow up in 6 months- MD; labs- cbc/cmp; Vit D 25-OH- Dr.B

## 2024-08-28 ENCOUNTER — Inpatient Hospital Stay: Admission: RE | Admit: 2024-08-28 | Discharge: 2024-08-28 | Attending: Internal Medicine

## 2024-08-28 DIAGNOSIS — Z1231 Encounter for screening mammogram for malignant neoplasm of breast: Secondary | ICD-10-CM | POA: Diagnosis present

## 2024-08-28 DIAGNOSIS — Z853 Personal history of malignant neoplasm of breast: Secondary | ICD-10-CM | POA: Diagnosis not present

## 2024-08-28 DIAGNOSIS — C50911 Malignant neoplasm of unspecified site of right female breast: Secondary | ICD-10-CM

## 2025-02-19 ENCOUNTER — Inpatient Hospital Stay: Admitting: Internal Medicine

## 2025-02-19 ENCOUNTER — Inpatient Hospital Stay
